# Patient Record
Sex: Female | Born: 1990 | Race: White | Hispanic: No | Marital: Married | Smoking: Never smoker
Health system: Southern US, Community
[De-identification: ages and names within clinical notes are randomized; demographics above are authoritative.]

---
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
## 2010-09-26 ENCOUNTER — Encounter: Payer: Self-pay | Admitting: Orthopaedic Surgery

## 2015-05-07 ENCOUNTER — Emergency Department (HOSPITAL_COMMUNITY): Payer: BLUE CROSS/BLUE SHIELD

## 2015-05-07 ENCOUNTER — Encounter (HOSPITAL_COMMUNITY): Payer: Self-pay | Admitting: Emergency Medicine

## 2015-05-07 ENCOUNTER — Emergency Department (HOSPITAL_COMMUNITY)
Admission: EM | Admit: 2015-05-07 | Discharge: 2015-05-07 | Disposition: A | Discharge status: Home / Self Care | Payer: BLUE CROSS/BLUE SHIELD | Attending: Emergency Medicine | Admitting: Emergency Medicine

## 2015-05-07 DIAGNOSIS — S0990XA Unspecified injury of head, initial encounter: Secondary | ICD-10-CM | POA: Insufficient documentation

## 2015-05-07 DIAGNOSIS — S0181XA Laceration without foreign body of other part of head, initial encounter: Secondary | ICD-10-CM

## 2015-05-07 DIAGNOSIS — Z79899 Other long term (current) drug therapy: Secondary | ICD-10-CM | POA: Diagnosis not present

## 2015-05-07 DIAGNOSIS — Y9364 Activity, baseball: Secondary | ICD-10-CM | POA: Diagnosis not present

## 2015-05-07 DIAGNOSIS — Y92320 Baseball field as the place of occurrence of the external cause: Secondary | ICD-10-CM | POA: Diagnosis not present

## 2015-05-07 DIAGNOSIS — Y998 Other external cause status: Secondary | ICD-10-CM | POA: Insufficient documentation

## 2015-05-07 DIAGNOSIS — S01112A Laceration without foreign body of left eyelid and periocular area, initial encounter: Secondary | ICD-10-CM | POA: Insufficient documentation

## 2015-05-07 DIAGNOSIS — W2111XA Struck by baseball bat, initial encounter: Secondary | ICD-10-CM | POA: Insufficient documentation

## 2015-05-07 MED ORDER — LIDOCAINE-EPINEPHRINE 2 %-1:100000 IJ SOLN
1.7000 mL | Freq: Once | INTRAMUSCULAR | Status: AC
  Administered 2015-05-07: 1.7 mL
  Filled 2015-05-07: qty 1

## 2015-05-07 NOTE — ED Notes (Signed)
AVS explained in detail. Acknowledged proper care of sutures and dermabond. No other c/c.
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
## 2015-05-07 NOTE — Discharge Instructions (Signed)
Facial Laceration A facial laceration is a cut on the face. These injuries can be painful and cause bleeding. Some cuts may need to be closed with stitches (sutures), skin adhesive strips, or wound glue. Cuts usually heal quickly but can leave a scar. It can take 1-2 years for the scar to go away completely. HOME CARE   Only take medicines as told by your doctor.  Follow your doctor's instructions for wound care. For Stitches:  Keep the cut clean and dry.  If you have a bandage (dressing), change it at least once a day. Change the bandage if it gets wet or dirty, or as told by your doctor.  Wash the cut with soap and water 2 times a day. Rinse the cut with water. Pat it dry with a clean towel.  Put a thin layer of medicated cream on the cut as told by your doctor.  You may shower after the first 24 hours. Do not soak the cut in water until the stitches are removed.  Have your stitches removed as told by your doctor.  Do not wear any makeup until a few days after your stitches are removed. For Skin Adhesive Strips:  Keep the cut clean and dry.  Do not get the strips wet. You may take a bath, but be careful to keep the cut dry.  If the cut gets wet, pat it dry with a clean towel.  The strips will fall off on their own. Do not remove the strips that are still stuck to the cut. For Wound Glue:  You may shower or take baths. Do not soak or scrub the cut. Do not swim. Avoid heavy sweating until the glue falls off on its own. After a shower or bath, pat the cut dry with a clean towel.  Do not put medicine or makeup on your cut until the glue falls off.  If you have a bandage, do not put tape over the glue.  Avoid lots of sunlight or tanning lamps until the glue falls off.  The glue will fall off on its own in 5-10 days. Do not pick at the glue. After Healing: Put sunscreen on the cut for the first year to reduce your scar. GET HELP RIGHT AWAY IF:   Your cut area gets red,  painful, or puffy (swollen).  You see a yellowish-white fluid (pus) coming from the cut.  You have chills or a fever. MAKE SURE YOU:   Understand these instructions.  Will watch your condition.  Will get help right away if you are not doing well or get worse. Document Released: 02/08/2008 Document Revised: 06/12/2013 Document Reviewed: 04/04/2013 [facility] Patient Information 2015 [place], [place]. This information is not intended to replace advice given to you by your health care provider. Make sure you discuss any questions you have with your health care provider. Ou had subcutaneous or under the skin sutures placed, which will dissolve on their return.  You've also had Dermabond placed over the laceration to help keep the margins aligned.  Please do not use any troponin.  Based oily products on or near the Dermabond until it comes off in 5-7 days  As discussed, your head CT is normal.  No skull fractures or intracranial bleed
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
## 2017-06-05 DIAGNOSIS — R109 Unspecified abdominal pain: Secondary | ICD-10-CM | POA: Diagnosis not present

## 2017-06-05 DIAGNOSIS — Z30431 Encounter for routine checking of intrauterine contraceptive device: Secondary | ICD-10-CM | POA: Diagnosis not present

## 2017-06-05 DIAGNOSIS — T8339XA Other mechanical complication of intrauterine contraceptive device, initial encounter: Secondary | ICD-10-CM | POA: Diagnosis not present

## 2017-06-05 DIAGNOSIS — N83202 Unspecified ovarian cyst, left side: Secondary | ICD-10-CM | POA: Diagnosis not present

## 2017-09-14 ENCOUNTER — Other Ambulatory Visit: Payer: Self-pay

## 2017-09-14 ENCOUNTER — Emergency Department
Admission: EM | Admit: 2017-09-14 | Discharge: 2017-09-14 | Disposition: A | Discharge status: Home / Self Care | Payer: Worker's Compensation | Attending: Emergency Medicine | Admitting: Emergency Medicine

## 2017-09-14 DIAGNOSIS — Y990 Civilian activity done for income or pay: Secondary | ICD-10-CM | POA: Diagnosis not present

## 2017-09-14 DIAGNOSIS — Y9389 Activity, other specified: Secondary | ICD-10-CM | POA: Insufficient documentation

## 2017-09-14 DIAGNOSIS — Z79899 Other long term (current) drug therapy: Secondary | ICD-10-CM | POA: Insufficient documentation

## 2017-09-14 DIAGNOSIS — S61112A Laceration without foreign body of left thumb with damage to nail, initial encounter: Secondary | ICD-10-CM | POA: Diagnosis present

## 2017-09-14 DIAGNOSIS — W260XXA Contact with knife, initial encounter: Secondary | ICD-10-CM | POA: Insufficient documentation

## 2017-09-14 DIAGNOSIS — Y929 Unspecified place or not applicable: Secondary | ICD-10-CM | POA: Diagnosis not present

## 2017-09-14 MED ORDER — OXYCODONE-ACETAMINOPHEN 5-325 MG PO TABS
1.0000 | ORAL_TABLET | Freq: Once | ORAL | Status: AC
  Administered 2017-09-14: 1 via ORAL
  Filled 2017-09-14: qty 1

## 2017-09-14 MED ORDER — ONDANSETRON 8 MG PO TBDP
8.0000 mg | ORAL_TABLET | Freq: Once | ORAL | Status: AC
  Administered 2017-09-14: 8 mg via ORAL
  Filled 2017-09-14: qty 1

## 2017-09-14 MED ORDER — ONDANSETRON HCL 8 MG PO TABS: 8.0000 mg | ORAL_TABLET | Freq: Three times a day (TID) | ORAL | 0 refills | Status: AC | PRN

## 2017-09-14 MED ORDER — LIDOCAINE HCL (PF) 1 % IJ SOLN
INTRAMUSCULAR | Status: AC
  Administered 2017-09-14: 5 mL
  Filled 2017-09-14: qty 5

## 2017-09-14 MED ORDER — BACITRACIN ZINC 500 UNIT/GM EX OINT: TOPICAL_OINTMENT | Freq: Two times a day (BID) | CUTANEOUS | Status: DC

## 2017-09-14 MED ORDER — TETANUS-DIPHTH-ACELL PERTUSSIS 5-2.5-18.5 LF-MCG/0.5 IM SUSP
0.5000 mL | Freq: Once | INTRAMUSCULAR | Status: AC
  Administered 2017-09-14: 0.5 mL via INTRAMUSCULAR
  Filled 2017-09-14: qty 0.5

## 2017-09-14 MED ORDER — OXYCODONE-ACETAMINOPHEN 7.5-325 MG PO TABS: 1.0000 | ORAL_TABLET | Freq: Four times a day (QID) | ORAL | 0 refills | Status: AC | PRN

## 2017-09-14 NOTE — ED Notes (Signed)
See triage note  Presents with laceration to left thumb area  Was using a box cutter at work and has laceration across nailbed

## 2017-09-14 NOTE — ED Triage Notes (Addendum)
Pt states she accidentally cut her left thumb diagonal across the nail bed and proximal with a box cutter while at work this morning . Bleeding controlled and guaze applied to wound.

## 2017-09-14 NOTE — ED Notes (Signed)
PA Ron in room sewing at this time.

## 2017-09-14 NOTE — ED Provider Notes (Addendum)
[facility] Emergency Department Provider Note   ____________________________________________   First MD Initiated Contact with Patient 09/14/17 1323     (approximate)  I have reviewed the triage vital signs and the nursing notes.   HISTORY  Chief Complaint Laceration    HPI Judith Taylor is a 27 y.o. female patient presents with a laceration to the dorsal aspect of her left thumb that occurred at work.  Laceration involves the nail and nailbed.  Patient was using a box cutter slipped and cut her thumb.  Bleeding is controlled with direct pressure.  Patient denies loss of sensation or loss of function of the left thumb.  Patient is right-hand dominant.  Patient states no pain at this time.  Patient tetanus shot is not up-to-date.  History reviewed. No pertinent past medical history.  There are no active problems to display for this patient.   History reviewed. No pertinent surgical history.  Prior to Admission medications   Medication Sig Start Date End Date Taking? Authorizing Provider  ibuprofen (ADVIL,MOTRIN) 200 MG tablet Take 200 mg by mouth every 6 (six) hours as needed for headache or moderate pain.    [provider]  Norethin Ace-Eth Estrad-FE (MINASTRIN 24 FE) 1-20 MG-MCG(24) CHEW Chew 1 tablet by mouth daily.    [provider]  ondansetron (ZOFRAN) 8 MG tablet Take 1 tablet (8 mg total) by mouth every 8 (eight) hours as needed for nausea or vomiting. 09/14/17   Joni Reining, PA-C  oxyCODONE-acetaminophen (PERCOCET) 7.5-325 MG tablet Take 1 tablet by mouth every 6 (six) hours as needed for severe pain. 09/14/17   Joni Reining, PA-C    Allergies Patient has no known allergies.  No family history on file.  Social History Social History   Tobacco Use  . Smoking status: Never Smoker  . Smokeless tobacco: Never Used  Substance Use Topics  . Alcohol use: Yes  . Drug use: Not on file    Review of  Systems Constitutional: No fever/chills Eyes: No visual changes. ENT: No sore throat. Cardiovascular: Denies chest pain. Respiratory: Denies shortness of breath. Gastrointestinal: No abdominal pain.  No nausea, no vomiting.  No diarrhea.  No constipation. Genitourinary: Negative for dysuria. Musculoskeletal: Negative for back pain. Skin: Negative for rash.  Laceration left thumb Neurological: Negative for headaches, focal weakness or numbness.   ____________________________________________   PHYSICAL EXAM:  VITAL SIGNS: ED Triage Vitals [09/14/17 1257]  Enc Vitals Group     BP 115/69     Pulse Rate 64     Resp 16     Temp 98.2 F (36.8 C)     Temp Source Oral     SpO2 100 %     Weight 130 lb (59 kg)     Height 5\' 6"  (1.676 m)     Head Circumference      Peak Flow      Pain Score      Pain Loc      Pain Edu?      Excl. in [place]?    Constitutional: Alert and oriented. Well appearing and in no acute distress. Cardiovascular: Normal rate, regular rhythm. Grossly normal heart sounds.  Good peripheral circulation. Respiratory: Normal respiratory effort.  No retractions. Lungs CTAB. Neurologic:  Normal speech and language. No gross focal neurologic deficits are appreciated. No gait instability. Skin:  3 cm laceration dorsal aspect of left thumb involving the proximal nail.  Psychiatric: Mood and affect are normal. Speech  and behavior are normal.  ____________________________________________   LABS (all labs ordered are listed, but only abnormal results are displayed)  Labs Reviewed - No data to display ____________________________________________  EKG   ____________________________________________  RADIOLOGY  No results found.  ____________________________________________   PROCEDURES  Procedure(s) performed:   Marland Kitchen.Marland Kitchen.Laceration Repair Date/Time: 09/26/2017 7:47 AM Performed by: Joni ReiningSmith, Ronald K, PA-C Authorized by: Joni ReiningSmith, Ronald K, PA-C   Consent:    Consent  obtained:  Verbal   Consent given by:  Patient   Risks discussed:  Infection and poor wound healing Anesthesia (see MAR for exact dosages):    Anesthesia method:  Nerve block   Block anesthetic:  Lidocaine 1% w/o epi   Block injection procedure:  Anatomic landmarks identified   Block outcome:  Anesthesia achieved Laceration details:    Location:  Finger   Finger location:  L thumb   Length (cm):  3 Repair type:    Repair type:  Simple Pre-procedure details:    Preparation:  Patient was prepped and draped in usual sterile fashion Exploration:    Hemostasis achieved with:  Direct pressure   Contaminated: no   Treatment:    Area cleansed with:  Betadine and saline   Amount of cleaning:  Standard   Irrigation solution:  Sterile saline   Irrigation method:  Syringe   Visualized foreign bodies/material removed: no   Skin repair:    Repair method:  Sutures   Suture size:  4-0   Suture material:  Nylon   Suture technique:  Simple interrupted   Number of sutures:  8 Approximation:    Approximation:  Close Post-procedure details:    Dressing:  Antibiotic ointment and sterile dressing   Patient tolerance of procedure:  Tolerated well, no immediate complications    Critical Care performed:   ____________________________________________   INITIAL IMPRESSION / ASSESSMENT AND PLAN / ED COURSE  As part of my medical decision making, I reviewed the following data within the electronic MEDICAL RECORD NUMBER    Left thumb laceration.  Wound was sutured.  Tetanus booster given.  Patient given discharge care instructions and advised to return back in 10 days for suture removal.     ____________________________________________   FINAL CLINICAL IMPRESSION(S) / ED DIAGNOSES  Final diagnoses:  Laceration of left thumb without foreign body with damage to nail, initial encounter     ED Discharge Orders        Ordered    oxyCODONE-acetaminophen (PERCOCET) 7.5-325 MG tablet  Every 6  hours PRN     09/14/17 1440    ondansetron (ZOFRAN) 8 MG tablet  Every 8 hours PRN     09/14/17 1440       Note:  This document was prepared using Dragon voice recognition software and may include unintentional dictation errors.    Joni ReiningSmith, Ronald K, PA-C 09/14/17 1606    Governor RooksLord, Rebecca, MD 09/16/17 1252    Joni ReiningSmith, Ronald K, PA-C 09/26/17 0750    Governor RooksLord, Rebecca, MD 09/26/17 1039

## 2017-09-14 NOTE — ED Notes (Signed)
Patient done drug screen but not enough urine ,gave patient cup of water  , patient will notify staff when needs to go again patient very willing

## 2017-09-25 DIAGNOSIS — L089 Local infection of the skin and subcutaneous tissue, unspecified: Secondary | ICD-10-CM | POA: Diagnosis not present

## 2017-09-25 DIAGNOSIS — Z4802 Encounter for removal of sutures: Secondary | ICD-10-CM | POA: Diagnosis not present

## 2017-09-25 DIAGNOSIS — T148XXS Other injury of unspecified body region, sequela: Secondary | ICD-10-CM | POA: Diagnosis not present

## 2017-11-07 DIAGNOSIS — M542 Cervicalgia: Secondary | ICD-10-CM | POA: Diagnosis not present

## 2017-11-07 DIAGNOSIS — M67912 Unspecified disorder of synovium and tendon, left shoulder: Secondary | ICD-10-CM | POA: Diagnosis not present

## 2017-11-22 DIAGNOSIS — M25512 Pain in left shoulder: Secondary | ICD-10-CM | POA: Diagnosis not present

## 2017-11-22 DIAGNOSIS — S161XXD Strain of muscle, fascia and tendon at neck level, subsequent encounter: Secondary | ICD-10-CM | POA: Diagnosis not present

## 2017-11-22 DIAGNOSIS — M25312 Other instability, left shoulder: Secondary | ICD-10-CM | POA: Diagnosis not present

## 2017-12-07 DIAGNOSIS — M25512 Pain in left shoulder: Secondary | ICD-10-CM | POA: Diagnosis not present

## 2018-01-09 DIAGNOSIS — L700 Acne vulgaris: Secondary | ICD-10-CM | POA: Diagnosis not present

## 2018-03-29 DIAGNOSIS — Z01419 Encounter for gynecological examination (general) (routine) without abnormal findings: Secondary | ICD-10-CM | POA: Diagnosis not present

## 2018-03-29 DIAGNOSIS — Z975 Presence of (intrauterine) contraceptive device: Secondary | ICD-10-CM | POA: Diagnosis not present

## 2018-03-29 DIAGNOSIS — Z6821 Body mass index (BMI) 21.0-21.9, adult: Secondary | ICD-10-CM | POA: Diagnosis not present

## 2018-03-29 DIAGNOSIS — Z1389 Encounter for screening for other disorder: Secondary | ICD-10-CM | POA: Diagnosis not present

## 2018-03-29 DIAGNOSIS — Z130 Encounter for screening for diseases of the blood and blood-forming organs and certain disorders involving the immune mechanism: Secondary | ICD-10-CM | POA: Diagnosis not present

## 2018-06-18 DIAGNOSIS — L309 Dermatitis, unspecified: Secondary | ICD-10-CM | POA: Diagnosis not present

## 2018-06-18 DIAGNOSIS — L700 Acne vulgaris: Secondary | ICD-10-CM | POA: Diagnosis not present

## 2019-04-26 DIAGNOSIS — M533 Sacrococcygeal disorders, not elsewhere classified: Secondary | ICD-10-CM | POA: Diagnosis not present

## 2019-05-08 ENCOUNTER — Other Ambulatory Visit: Payer: Self-pay | Admitting: Family Medicine

## 2019-05-08 DIAGNOSIS — M533 Sacrococcygeal disorders, not elsewhere classified: Secondary | ICD-10-CM

## 2019-05-28 ENCOUNTER — Other Ambulatory Visit: Payer: Self-pay

## 2019-05-28 DIAGNOSIS — Z20822 Contact with and (suspected) exposure to covid-19: Secondary | ICD-10-CM

## 2019-05-29 LAB — NOVEL CORONAVIRUS, NAA: SARS-CoV-2, NAA: NOT DETECTED

## 2019-05-31 ENCOUNTER — Other Ambulatory Visit: Payer: Self-pay

## 2019-07-29 ENCOUNTER — Other Ambulatory Visit: Payer: Self-pay

## 2019-07-29 DIAGNOSIS — Z20822 Contact with and (suspected) exposure to covid-19: Secondary | ICD-10-CM

## 2019-07-30 LAB — NOVEL CORONAVIRUS, NAA: SARS-CoV-2, NAA: NOT DETECTED

## 2019-09-02 ENCOUNTER — Ambulatory Visit: Payer: HRSA Program | Attending: Internal Medicine

## 2019-09-02 DIAGNOSIS — Z20822 Contact with and (suspected) exposure to covid-19: Secondary | ICD-10-CM

## 2019-09-02 DIAGNOSIS — Z20828 Contact with and (suspected) exposure to other viral communicable diseases: Secondary | ICD-10-CM | POA: Insufficient documentation

## 2019-09-03 ENCOUNTER — Other Ambulatory Visit: Payer: Self-pay

## 2019-09-04 LAB — NOVEL CORONAVIRUS, NAA: SARS-CoV-2, NAA: NOT DETECTED

## 2019-09-07 ENCOUNTER — Other Ambulatory Visit: Payer: Self-pay

## 2019-09-07 ENCOUNTER — Emergency Department (INDEPENDENT_AMBULATORY_CARE_PROVIDER_SITE_OTHER)
Admission: EM | Admit: 2019-09-07 | Discharge: 2019-09-07 | Disposition: A | Discharge status: Home / Self Care | Payer: BLUE CROSS/BLUE SHIELD | Source: Home / Self Care | Attending: Family Medicine | Admitting: Family Medicine

## 2019-09-07 DIAGNOSIS — R509 Fever, unspecified: Secondary | ICD-10-CM

## 2019-09-07 DIAGNOSIS — J069 Acute upper respiratory infection, unspecified: Secondary | ICD-10-CM

## 2019-09-07 NOTE — Discharge Instructions (Addendum)
Take plain guaifenesin (1200mg  extended release tabs such as Mucinex) twice daily, with plenty of water, for cough and congestion.  May add Pseudoephedrine (30mg , one or two every 4 to 6 hours) for sinus congestion.  Get adequate rest.   Also recommend using saline nasal spray several times daily and saline nasal irrigation (AYR is a common brand).  Use Flonase nasal spray each morning after using Afrin nasal spray and saline nasal irrigation. Try warm salt water gargles for sore throat.  Stop all antihistamines for now, and other non-prescription cough/cold preparations. May take Ibuprofen 200mg , 4 tabs every 8 hours with food for body aches, headache, etc. May take Delsym Cough Suppressant at bedtime for nighttime cough.   Isolate yourself until COVID-19 test result is available.   If your COVID19 test is positive, then you are infected with the novel coronavirus and could give the virus to others.  Please continue isolation at home for at least 10 days since the start of your symptoms.  Once you complete your 10 day quarantine, you may return to normal activities as long as you've not had a fever for over 24 hours (without taking fever reducing medicine) and your symptoms are improving. Please continue good preventive care measures, including:  frequent hand-washing, avoid touching your face, cover coughs/sneezes, stay out of crowds and keep a 6 foot distance from others.  Go to the nearest hospital emergency room if fever/cough/breathlessness are severe or illness seems like a threat to life.

## 2019-09-07 NOTE — ED Triage Notes (Signed)
Pt c/o fever, bodyaches and cough x 2 days. Fever was 102 yesterday.

## 2019-09-07 NOTE — ED Provider Notes (Signed)
[facility]    CSN: [id number] Arrival date & time: 09/07/19  1220      History   Chief Complaint Chief Complaint  Patient presents with  . Fever  . Cough  . Generalized Body Aches    HPI Judith Taylor is a 29 y.o. female.   During the past 3 days patient has developed a cough, sinus congestion, fatigue, myalgias, and headache.  She had diarrhea initially, now resolved, and had fever yesterday.  Yesterday she noted decreased sensation of taste/smell.  She complains of tightness in her anterior chest, but no pleuritic pain. She has had pneumonia in the past.  The history is provided by the patient.    History reviewed. No pertinent past medical history.  There are no problems to display for this patient.   History reviewed. No pertinent surgical history.  OB History   No obstetric history on file.      Home Medications    Prior to Admission medications   Medication Sig Start Date End Date Taking? Authorizing Provider  ibuprofen (ADVIL,MOTRIN) 200 MG tablet Take 200 mg by mouth every 6 (six) hours as needed for headache or moderate pain.    [provider]  Norethin Ace-Eth Estrad-FE (MINASTRIN 24 FE) 1-20 MG-MCG(24) CHEW Chew 1 tablet by mouth daily.    [provider]  ondansetron (ZOFRAN) 8 MG tablet Take 1 tablet (8 mg total) by mouth every 8 (eight) hours as needed for nausea or vomiting. 09/14/17   Sable Feil, PA-C  oxyCODONE-acetaminophen (PERCOCET) 7.5-325 MG tablet Take 1 tablet by mouth every 6 (six) hours as needed for severe pain. 09/14/17   Sable Feil, PA-C    Family History History reviewed. No pertinent family history.  Social History Social History   Tobacco Use  . Smoking status: Never Smoker  . Smokeless tobacco: Never Used  Substance Use Topics  . Alcohol use: Yes  . Drug use: Not on file     Allergies   Patient has no known allergies.   Review of Systems Review of Systems No sore  throat + cough No pleuritic pain No wheezing + nasal congestion + post-nasal drainage No sinus pain/pressure No itchy/red eyes No earache No hemoptysis + SOB + fever, + chills No nausea No vomiting No abdominal pain + diarrhea No urinary symptoms No skin rash + fatigue + myalgias + headache    Physical Exam Triage Vital Signs ED Triage Vitals  Enc Vitals Group     BP 09/07/19 1429 122/84     Pulse Rate 09/07/19 1429 (!) 104     Resp 09/07/19 1429 18     Temp 09/07/19 1429 99.8 F (37.7 C)     Temp Source 09/07/19 1429 Oral     SpO2 09/07/19 1429 99 %     Weight 09/07/19 1430 140 lb (63.5 kg)     Height 09/07/19 1430 5\' 6"  (1.676 m)     Head Circumference --      Peak Flow --      Pain Score 09/07/19 1430 0     Pain Loc --      Pain Edu? --      Excl. in [place]? --    No data found.  Updated Vital Signs BP 122/84 (BP Location: Left Arm)   Pulse (!) 104   Temp 99.8 F (37.7 C) (Oral)   Resp 18   Ht 5\' 6"  (1.676 m)   Wt 63.5 kg  SpO2 99%   BMI 22.60 kg/m   Visual Acuity Right Eye Distance:   Left Eye Distance:   Bilateral Distance:    Right Eye Near:   Left Eye Near:    Bilateral Near:     Physical Exam Nursing notes and Vital Signs reviewed. Appearance:  Patient appears stated age, and in no acute distress Eyes:  Pupils are equal, round, and reactive to light and accomodation.  Extraocular movement is intact.  Conjunctivae are not inflamed  Ears:  Canals normal.  Tympanic membranes normal.  Nose:   Normal turbinates.  No sinus tenderness.  Pharynx:  Normal Neck:  Supple.  Mildly enlarged lateral nodes are present, tender to palpation on the left.   Lungs:  Clear to auscultation.  Breath sounds are equal.  Moving air well. Heart:  Regular rate and rhythm without murmurs, rubs, or gallops.  Abdomen:  Nontender without masses or hepatosplenomegaly.  Bowel sounds are present.  No CVA or flank tenderness.  Extremities:  No edema.  Skin:  No rash  present.   UC Treatments / Results  Labs (all labs ordered are listed, but only abnormal results are displayed) Labs Reviewed  SARS-COV-2 RNA,(COVID-19) QUALITATIVE NAAT    EKG   Radiology No results found.  Procedures Procedures (including critical care time)  Medications Ordered in UC Medications - No data to display  Initial Impression / Assessment and Plan / UC Course  I have reviewed the triage vital signs and the nursing notes.  Pertinent labs & imaging results that were available during my care of the patient were reviewed by me and considered in my medical decision making (see chart for details).    Benign exam. There is no evidence of bacterial infection today.  Treat symptomatically for now. COVID19 send out   Final Clinical Impressions(s) / UC Diagnoses   Final diagnoses:  Fever, unspecified  Viral URI with cough     Discharge Instructions     Take plain guaifenesin (1200mg  extended release tabs such as Mucinex) twice daily, with plenty of water, for cough and congestion.  May add Pseudoephedrine (30mg , one or two every 4 to 6 hours) for sinus congestion.  Get adequate rest.   Also recommend using saline nasal spray several times daily and saline nasal irrigation (AYR is a common brand).  Use Flonase nasal spray each morning after using Afrin nasal spray and saline nasal irrigation. Try warm salt water gargles for sore throat.  Stop all antihistamines for now, and other non-prescription cough/cold preparations. May take Ibuprofen 200mg , 4 tabs every 8 hours with food for body aches, headache, etc. May take Delsym Cough Suppressant at bedtime for nighttime cough.   Isolate yourself until COVID-19 test result is available.   If your COVID19 test is positive, then you are infected with the novel coronavirus and could give the virus to others.  Please continue isolation at home for at least 10 days since the start of your symptoms.  Once you complete your 10  day quarantine, you may return to normal activities as long as you've not had a fever for over 24 hours (without taking fever reducing medicine) and your symptoms are improving. Please continue good preventive care measures, including:  frequent hand-washing, avoid touching your face, cover coughs/sneezes, stay out of crowds and keep a 6 foot distance from others.  Go to the nearest hospital emergency room if fever/cough/breathlessness are severe or illness seems like a threat to life.      ED  Prescriptions    None        Lattie Haw, MD 09/14/19 [phone]

## 2019-09-09 ENCOUNTER — Other Ambulatory Visit: Payer: BLUE CROSS/BLUE SHIELD

## 2019-09-09 LAB — SARS-COV-2 RNA,(COVID-19) QUALITATIVE NAAT: SARS CoV2 RNA: DETECTED — AB

## 2019-09-10 ENCOUNTER — Telehealth: Payer: Self-pay | Admitting: Emergency Medicine

## 2019-09-10 NOTE — Telephone Encounter (Signed)

## 2019-12-20 DIAGNOSIS — F411 Generalized anxiety disorder: Secondary | ICD-10-CM | POA: Diagnosis not present

## 2019-12-20 DIAGNOSIS — F321 Major depressive disorder, single episode, moderate: Secondary | ICD-10-CM | POA: Diagnosis not present

## 2019-12-30 DIAGNOSIS — F321 Major depressive disorder, single episode, moderate: Secondary | ICD-10-CM | POA: Diagnosis not present

## 2019-12-30 DIAGNOSIS — F411 Generalized anxiety disorder: Secondary | ICD-10-CM | POA: Diagnosis not present

## 2020-01-31 DIAGNOSIS — F411 Generalized anxiety disorder: Secondary | ICD-10-CM | POA: Diagnosis not present

## 2020-01-31 DIAGNOSIS — F321 Major depressive disorder, single episode, moderate: Secondary | ICD-10-CM | POA: Diagnosis not present

## 2020-02-21 DIAGNOSIS — F411 Generalized anxiety disorder: Secondary | ICD-10-CM | POA: Diagnosis not present

## 2020-02-21 DIAGNOSIS — F321 Major depressive disorder, single episode, moderate: Secondary | ICD-10-CM | POA: Diagnosis not present

## 2020-02-28 DIAGNOSIS — F411 Generalized anxiety disorder: Secondary | ICD-10-CM | POA: Diagnosis not present

## 2020-02-28 DIAGNOSIS — F321 Major depressive disorder, single episode, moderate: Secondary | ICD-10-CM | POA: Diagnosis not present

## 2020-03-06 DIAGNOSIS — F411 Generalized anxiety disorder: Secondary | ICD-10-CM | POA: Diagnosis not present

## 2020-03-06 DIAGNOSIS — F321 Major depressive disorder, single episode, moderate: Secondary | ICD-10-CM | POA: Diagnosis not present

## 2020-03-11 DIAGNOSIS — F411 Generalized anxiety disorder: Secondary | ICD-10-CM | POA: Diagnosis not present

## 2020-03-11 DIAGNOSIS — F321 Major depressive disorder, single episode, moderate: Secondary | ICD-10-CM | POA: Diagnosis not present

## 2020-03-27 DIAGNOSIS — F321 Major depressive disorder, single episode, moderate: Secondary | ICD-10-CM | POA: Diagnosis not present

## 2020-03-27 DIAGNOSIS — F411 Generalized anxiety disorder: Secondary | ICD-10-CM | POA: Diagnosis not present

## 2020-04-03 DIAGNOSIS — F411 Generalized anxiety disorder: Secondary | ICD-10-CM | POA: Diagnosis not present

## 2020-04-03 DIAGNOSIS — F321 Major depressive disorder, single episode, moderate: Secondary | ICD-10-CM | POA: Diagnosis not present

## 2020-04-08 DIAGNOSIS — F321 Major depressive disorder, single episode, moderate: Secondary | ICD-10-CM | POA: Diagnosis not present

## 2020-04-08 DIAGNOSIS — F411 Generalized anxiety disorder: Secondary | ICD-10-CM | POA: Diagnosis not present

## 2020-05-01 DIAGNOSIS — F411 Generalized anxiety disorder: Secondary | ICD-10-CM | POA: Diagnosis not present

## 2020-05-01 DIAGNOSIS — F321 Major depressive disorder, single episode, moderate: Secondary | ICD-10-CM | POA: Diagnosis not present

## 2020-05-13 DIAGNOSIS — F411 Generalized anxiety disorder: Secondary | ICD-10-CM | POA: Diagnosis not present

## 2020-05-13 DIAGNOSIS — F321 Major depressive disorder, single episode, moderate: Secondary | ICD-10-CM | POA: Diagnosis not present

## 2020-05-25 DIAGNOSIS — Z0000 Encounter for general adult medical examination without abnormal findings: Secondary | ICD-10-CM | POA: Diagnosis not present

## 2020-05-25 DIAGNOSIS — R7309 Other abnormal glucose: Secondary | ICD-10-CM | POA: Diagnosis not present

## 2020-05-25 DIAGNOSIS — R7989 Other specified abnormal findings of blood chemistry: Secondary | ICD-10-CM | POA: Diagnosis not present

## 2020-05-28 DIAGNOSIS — F411 Generalized anxiety disorder: Secondary | ICD-10-CM | POA: Diagnosis not present

## 2020-05-28 DIAGNOSIS — F321 Major depressive disorder, single episode, moderate: Secondary | ICD-10-CM | POA: Diagnosis not present

## 2020-06-02 DIAGNOSIS — Z0000 Encounter for general adult medical examination without abnormal findings: Secondary | ICD-10-CM | POA: Diagnosis not present

## 2020-06-17 DIAGNOSIS — F411 Generalized anxiety disorder: Secondary | ICD-10-CM | POA: Diagnosis not present

## 2020-06-17 DIAGNOSIS — F321 Major depressive disorder, single episode, moderate: Secondary | ICD-10-CM | POA: Diagnosis not present

## 2020-07-02 DIAGNOSIS — F411 Generalized anxiety disorder: Secondary | ICD-10-CM | POA: Diagnosis not present

## 2020-07-02 DIAGNOSIS — F321 Major depressive disorder, single episode, moderate: Secondary | ICD-10-CM | POA: Diagnosis not present

## 2020-07-08 DIAGNOSIS — F321 Major depressive disorder, single episode, moderate: Secondary | ICD-10-CM | POA: Diagnosis not present

## 2020-07-08 DIAGNOSIS — F411 Generalized anxiety disorder: Secondary | ICD-10-CM | POA: Diagnosis not present

## 2020-07-24 DIAGNOSIS — F411 Generalized anxiety disorder: Secondary | ICD-10-CM | POA: Diagnosis not present

## 2020-07-24 DIAGNOSIS — F321 Major depressive disorder, single episode, moderate: Secondary | ICD-10-CM | POA: Diagnosis not present

## 2020-08-14 DIAGNOSIS — F321 Major depressive disorder, single episode, moderate: Secondary | ICD-10-CM | POA: Diagnosis not present

## 2020-08-14 DIAGNOSIS — F411 Generalized anxiety disorder: Secondary | ICD-10-CM | POA: Diagnosis not present

## 2020-08-27 DIAGNOSIS — F411 Generalized anxiety disorder: Secondary | ICD-10-CM | POA: Diagnosis not present

## 2020-08-27 DIAGNOSIS — F321 Major depressive disorder, single episode, moderate: Secondary | ICD-10-CM | POA: Diagnosis not present

## 2020-09-11 DIAGNOSIS — F321 Major depressive disorder, single episode, moderate: Secondary | ICD-10-CM | POA: Diagnosis not present

## 2020-09-11 DIAGNOSIS — F411 Generalized anxiety disorder: Secondary | ICD-10-CM | POA: Diagnosis not present

## 2020-09-25 DIAGNOSIS — F411 Generalized anxiety disorder: Secondary | ICD-10-CM | POA: Diagnosis not present

## 2020-09-25 DIAGNOSIS — F321 Major depressive disorder, single episode, moderate: Secondary | ICD-10-CM | POA: Diagnosis not present

## 2020-10-23 DIAGNOSIS — F411 Generalized anxiety disorder: Secondary | ICD-10-CM | POA: Diagnosis not present

## 2020-10-23 DIAGNOSIS — F321 Major depressive disorder, single episode, moderate: Secondary | ICD-10-CM | POA: Diagnosis not present

## 2020-10-23 DIAGNOSIS — F331 Major depressive disorder, recurrent, moderate: Secondary | ICD-10-CM | POA: Diagnosis not present

## 2020-11-13 DIAGNOSIS — F411 Generalized anxiety disorder: Secondary | ICD-10-CM | POA: Diagnosis not present

## 2020-11-13 DIAGNOSIS — F321 Major depressive disorder, single episode, moderate: Secondary | ICD-10-CM | POA: Diagnosis not present

## 2020-11-20 DIAGNOSIS — F411 Generalized anxiety disorder: Secondary | ICD-10-CM | POA: Diagnosis not present

## 2020-11-20 DIAGNOSIS — F331 Major depressive disorder, recurrent, moderate: Secondary | ICD-10-CM | POA: Diagnosis not present

## 2020-11-27 DIAGNOSIS — F411 Generalized anxiety disorder: Secondary | ICD-10-CM | POA: Diagnosis not present

## 2020-11-27 DIAGNOSIS — F321 Major depressive disorder, single episode, moderate: Secondary | ICD-10-CM | POA: Diagnosis not present

## 2020-12-01 DIAGNOSIS — F411 Generalized anxiety disorder: Secondary | ICD-10-CM | POA: Diagnosis not present

## 2020-12-01 DIAGNOSIS — F331 Major depressive disorder, recurrent, moderate: Secondary | ICD-10-CM | POA: Diagnosis not present

## 2020-12-11 DIAGNOSIS — F411 Generalized anxiety disorder: Secondary | ICD-10-CM | POA: Diagnosis not present

## 2020-12-11 DIAGNOSIS — F321 Major depressive disorder, single episode, moderate: Secondary | ICD-10-CM | POA: Diagnosis not present

## 2020-12-16 DIAGNOSIS — F331 Major depressive disorder, recurrent, moderate: Secondary | ICD-10-CM | POA: Diagnosis not present

## 2020-12-16 DIAGNOSIS — F411 Generalized anxiety disorder: Secondary | ICD-10-CM | POA: Diagnosis not present

## 2021-01-07 DIAGNOSIS — F321 Major depressive disorder, single episode, moderate: Secondary | ICD-10-CM | POA: Diagnosis not present

## 2021-01-07 DIAGNOSIS — F411 Generalized anxiety disorder: Secondary | ICD-10-CM | POA: Diagnosis not present

## 2021-01-20 DIAGNOSIS — F411 Generalized anxiety disorder: Secondary | ICD-10-CM | POA: Diagnosis not present

## 2021-01-20 DIAGNOSIS — F331 Major depressive disorder, recurrent, moderate: Secondary | ICD-10-CM | POA: Diagnosis not present

## 2021-02-09 DIAGNOSIS — F411 Generalized anxiety disorder: Secondary | ICD-10-CM | POA: Diagnosis not present

## 2021-02-09 DIAGNOSIS — F331 Major depressive disorder, recurrent, moderate: Secondary | ICD-10-CM | POA: Diagnosis not present

## 2021-02-15 DIAGNOSIS — Z20822 Contact with and (suspected) exposure to covid-19: Secondary | ICD-10-CM | POA: Diagnosis not present

## 2021-02-26 DIAGNOSIS — F411 Generalized anxiety disorder: Secondary | ICD-10-CM | POA: Diagnosis not present

## 2021-02-26 DIAGNOSIS — F331 Major depressive disorder, recurrent, moderate: Secondary | ICD-10-CM | POA: Diagnosis not present

## 2021-03-10 DIAGNOSIS — F411 Generalized anxiety disorder: Secondary | ICD-10-CM | POA: Diagnosis not present

## 2021-03-10 DIAGNOSIS — F331 Major depressive disorder, recurrent, moderate: Secondary | ICD-10-CM | POA: Diagnosis not present

## 2021-03-11 DIAGNOSIS — F331 Major depressive disorder, recurrent, moderate: Secondary | ICD-10-CM | POA: Diagnosis not present

## 2021-03-15 DIAGNOSIS — F411 Generalized anxiety disorder: Secondary | ICD-10-CM | POA: Diagnosis not present

## 2021-03-15 DIAGNOSIS — F331 Major depressive disorder, recurrent, moderate: Secondary | ICD-10-CM | POA: Diagnosis not present

## 2021-04-01 DIAGNOSIS — Z30432 Encounter for removal of intrauterine contraceptive device: Secondary | ICD-10-CM | POA: Diagnosis not present

## 2021-04-16 DIAGNOSIS — F411 Generalized anxiety disorder: Secondary | ICD-10-CM | POA: Diagnosis not present

## 2021-04-16 DIAGNOSIS — F331 Major depressive disorder, recurrent, moderate: Secondary | ICD-10-CM | POA: Diagnosis not present

## 2021-04-22 DIAGNOSIS — F331 Major depressive disorder, recurrent, moderate: Secondary | ICD-10-CM | POA: Diagnosis not present

## 2021-04-26 DIAGNOSIS — F331 Major depressive disorder, recurrent, moderate: Secondary | ICD-10-CM | POA: Diagnosis not present

## 2021-04-26 DIAGNOSIS — F411 Generalized anxiety disorder: Secondary | ICD-10-CM | POA: Diagnosis not present

## 2021-05-19 DIAGNOSIS — F331 Major depressive disorder, recurrent, moderate: Secondary | ICD-10-CM | POA: Diagnosis not present

## 2021-05-19 DIAGNOSIS — F411 Generalized anxiety disorder: Secondary | ICD-10-CM | POA: Diagnosis not present

## 2021-05-27 DIAGNOSIS — F331 Major depressive disorder, recurrent, moderate: Secondary | ICD-10-CM | POA: Diagnosis not present

## 2021-05-27 DIAGNOSIS — F411 Generalized anxiety disorder: Secondary | ICD-10-CM | POA: Diagnosis not present

## 2021-06-11 DIAGNOSIS — F331 Major depressive disorder, recurrent, moderate: Secondary | ICD-10-CM | POA: Diagnosis not present

## 2021-06-11 DIAGNOSIS — F411 Generalized anxiety disorder: Secondary | ICD-10-CM | POA: Diagnosis not present

## 2021-06-17 DIAGNOSIS — F331 Major depressive disorder, recurrent, moderate: Secondary | ICD-10-CM | POA: Diagnosis not present

## 2021-06-17 DIAGNOSIS — F411 Generalized anxiety disorder: Secondary | ICD-10-CM | POA: Diagnosis not present

## 2021-07-02 DIAGNOSIS — F331 Major depressive disorder, recurrent, moderate: Secondary | ICD-10-CM | POA: Diagnosis not present

## 2021-07-02 DIAGNOSIS — F411 Generalized anxiety disorder: Secondary | ICD-10-CM | POA: Diagnosis not present

## 2021-07-12 DIAGNOSIS — F331 Major depressive disorder, recurrent, moderate: Secondary | ICD-10-CM | POA: Diagnosis not present

## 2021-07-12 DIAGNOSIS — F411 Generalized anxiety disorder: Secondary | ICD-10-CM | POA: Diagnosis not present

## 2021-08-11 DIAGNOSIS — F331 Major depressive disorder, recurrent, moderate: Secondary | ICD-10-CM | POA: Diagnosis not present

## 2021-08-11 DIAGNOSIS — F411 Generalized anxiety disorder: Secondary | ICD-10-CM | POA: Diagnosis not present

## 2021-08-19 DIAGNOSIS — F331 Major depressive disorder, recurrent, moderate: Secondary | ICD-10-CM | POA: Diagnosis not present

## 2021-08-19 DIAGNOSIS — F411 Generalized anxiety disorder: Secondary | ICD-10-CM | POA: Diagnosis not present

## 2021-08-23 DIAGNOSIS — F411 Generalized anxiety disorder: Secondary | ICD-10-CM | POA: Diagnosis not present

## 2021-08-23 DIAGNOSIS — F331 Major depressive disorder, recurrent, moderate: Secondary | ICD-10-CM | POA: Diagnosis not present

## 2021-08-26 DIAGNOSIS — Z01419 Encounter for gynecological examination (general) (routine) without abnormal findings: Secondary | ICD-10-CM | POA: Diagnosis not present

## 2021-08-26 DIAGNOSIS — F331 Major depressive disorder, recurrent, moderate: Secondary | ICD-10-CM | POA: Diagnosis not present

## 2021-08-26 DIAGNOSIS — Z1389 Encounter for screening for other disorder: Secondary | ICD-10-CM | POA: Diagnosis not present

## 2021-08-26 DIAGNOSIS — Z130 Encounter for screening for diseases of the blood and blood-forming organs and certain disorders involving the immune mechanism: Secondary | ICD-10-CM | POA: Diagnosis not present

## 2021-08-26 DIAGNOSIS — Z124 Encounter for screening for malignant neoplasm of cervix: Secondary | ICD-10-CM | POA: Diagnosis not present

## 2021-08-26 DIAGNOSIS — F411 Generalized anxiety disorder: Secondary | ICD-10-CM | POA: Diagnosis not present

## 2021-08-31 DIAGNOSIS — F331 Major depressive disorder, recurrent, moderate: Secondary | ICD-10-CM | POA: Diagnosis not present

## 2021-08-31 DIAGNOSIS — F411 Generalized anxiety disorder: Secondary | ICD-10-CM | POA: Diagnosis not present

## 2021-09-03 DIAGNOSIS — F411 Generalized anxiety disorder: Secondary | ICD-10-CM | POA: Diagnosis not present

## 2021-09-03 DIAGNOSIS — F331 Major depressive disorder, recurrent, moderate: Secondary | ICD-10-CM | POA: Diagnosis not present

## 2021-09-09 DIAGNOSIS — F331 Major depressive disorder, recurrent, moderate: Secondary | ICD-10-CM | POA: Diagnosis not present

## 2021-09-09 DIAGNOSIS — F411 Generalized anxiety disorder: Secondary | ICD-10-CM | POA: Diagnosis not present

## 2021-09-10 DIAGNOSIS — F411 Generalized anxiety disorder: Secondary | ICD-10-CM | POA: Diagnosis not present

## 2021-09-10 DIAGNOSIS — F3181 Bipolar II disorder: Secondary | ICD-10-CM | POA: Diagnosis not present

## 2021-09-14 DIAGNOSIS — F3181 Bipolar II disorder: Secondary | ICD-10-CM | POA: Diagnosis not present

## 2021-09-14 DIAGNOSIS — F411 Generalized anxiety disorder: Secondary | ICD-10-CM | POA: Diagnosis not present

## 2021-09-16 DIAGNOSIS — F411 Generalized anxiety disorder: Secondary | ICD-10-CM | POA: Diagnosis not present

## 2021-09-16 DIAGNOSIS — F331 Major depressive disorder, recurrent, moderate: Secondary | ICD-10-CM | POA: Diagnosis not present

## 2021-09-24 DIAGNOSIS — F3181 Bipolar II disorder: Secondary | ICD-10-CM | POA: Diagnosis not present

## 2021-09-24 DIAGNOSIS — F411 Generalized anxiety disorder: Secondary | ICD-10-CM | POA: Diagnosis not present

## 2021-09-28 DIAGNOSIS — F331 Major depressive disorder, recurrent, moderate: Secondary | ICD-10-CM | POA: Diagnosis not present

## 2021-09-28 DIAGNOSIS — F411 Generalized anxiety disorder: Secondary | ICD-10-CM | POA: Diagnosis not present

## 2021-09-30 DIAGNOSIS — F411 Generalized anxiety disorder: Secondary | ICD-10-CM | POA: Diagnosis not present

## 2021-09-30 DIAGNOSIS — F3181 Bipolar II disorder: Secondary | ICD-10-CM | POA: Diagnosis not present

## 2021-10-08 DIAGNOSIS — F411 Generalized anxiety disorder: Secondary | ICD-10-CM | POA: Diagnosis not present

## 2021-10-08 DIAGNOSIS — F3181 Bipolar II disorder: Secondary | ICD-10-CM | POA: Diagnosis not present

## 2021-10-14 DIAGNOSIS — F411 Generalized anxiety disorder: Secondary | ICD-10-CM | POA: Diagnosis not present

## 2021-10-14 DIAGNOSIS — F331 Major depressive disorder, recurrent, moderate: Secondary | ICD-10-CM | POA: Diagnosis not present

## 2021-10-18 DIAGNOSIS — F3181 Bipolar II disorder: Secondary | ICD-10-CM | POA: Diagnosis not present

## 2021-10-18 DIAGNOSIS — F411 Generalized anxiety disorder: Secondary | ICD-10-CM | POA: Diagnosis not present

## 2021-11-01 DIAGNOSIS — F3181 Bipolar II disorder: Secondary | ICD-10-CM | POA: Diagnosis not present

## 2021-11-01 DIAGNOSIS — F411 Generalized anxiety disorder: Secondary | ICD-10-CM | POA: Diagnosis not present

## 2021-11-03 DIAGNOSIS — F411 Generalized anxiety disorder: Secondary | ICD-10-CM | POA: Diagnosis not present

## 2021-11-03 DIAGNOSIS — F331 Major depressive disorder, recurrent, moderate: Secondary | ICD-10-CM | POA: Diagnosis not present

## 2021-11-15 DIAGNOSIS — F3181 Bipolar II disorder: Secondary | ICD-10-CM | POA: Diagnosis not present

## 2021-11-15 DIAGNOSIS — F411 Generalized anxiety disorder: Secondary | ICD-10-CM | POA: Diagnosis not present

## 2021-11-24 DIAGNOSIS — F331 Major depressive disorder, recurrent, moderate: Secondary | ICD-10-CM | POA: Diagnosis not present

## 2021-11-24 DIAGNOSIS — F411 Generalized anxiety disorder: Secondary | ICD-10-CM | POA: Diagnosis not present

## 2021-11-26 DIAGNOSIS — F3181 Bipolar II disorder: Secondary | ICD-10-CM | POA: Diagnosis not present

## 2021-11-26 DIAGNOSIS — F411 Generalized anxiety disorder: Secondary | ICD-10-CM | POA: Diagnosis not present

## 2021-12-08 DIAGNOSIS — F411 Generalized anxiety disorder: Secondary | ICD-10-CM | POA: Diagnosis not present

## 2021-12-08 DIAGNOSIS — F3181 Bipolar II disorder: Secondary | ICD-10-CM | POA: Diagnosis not present

## 2021-12-14 ENCOUNTER — Other Ambulatory Visit: Payer: Self-pay | Admitting: Dentistry

## 2021-12-14 DIAGNOSIS — M26633 Articular disc disorder of bilateral temporomandibular joint: Secondary | ICD-10-CM

## 2021-12-14 DIAGNOSIS — R6884 Jaw pain: Secondary | ICD-10-CM

## 2021-12-15 DIAGNOSIS — F331 Major depressive disorder, recurrent, moderate: Secondary | ICD-10-CM | POA: Diagnosis not present

## 2021-12-15 DIAGNOSIS — F411 Generalized anxiety disorder: Secondary | ICD-10-CM | POA: Diagnosis not present

## 2021-12-15 DIAGNOSIS — H938X3 Other specified disorders of ear, bilateral: Secondary | ICD-10-CM | POA: Diagnosis not present

## 2021-12-19 ENCOUNTER — Other Ambulatory Visit: Payer: BLUE CROSS/BLUE SHIELD

## 2021-12-22 DIAGNOSIS — R6884 Jaw pain: Secondary | ICD-10-CM | POA: Diagnosis not present

## 2021-12-22 DIAGNOSIS — F419 Anxiety disorder, unspecified: Secondary | ICD-10-CM | POA: Diagnosis not present

## 2021-12-24 DIAGNOSIS — F411 Generalized anxiety disorder: Secondary | ICD-10-CM | POA: Diagnosis not present

## 2021-12-24 DIAGNOSIS — F3181 Bipolar II disorder: Secondary | ICD-10-CM | POA: Diagnosis not present

## 2021-12-30 ENCOUNTER — Ambulatory Visit
Admission: RE | Admit: 2021-12-30 | Discharge: 2021-12-30 | Disposition: A | Discharge status: Home / Self Care | Payer: BLUE CROSS/BLUE SHIELD | Source: Ambulatory Visit | Attending: Dentistry | Admitting: Dentistry

## 2021-12-30 DIAGNOSIS — M26601 Right temporomandibular joint disorder, unspecified: Secondary | ICD-10-CM | POA: Diagnosis not present

## 2021-12-30 DIAGNOSIS — M26602 Left temporomandibular joint disorder, unspecified: Secondary | ICD-10-CM | POA: Diagnosis not present

## 2021-12-30 DIAGNOSIS — S0302XA Dislocation of jaw, left side, initial encounter: Secondary | ICD-10-CM | POA: Diagnosis not present

## 2021-12-30 DIAGNOSIS — S0301XA Dislocation of jaw, right side, initial encounter: Secondary | ICD-10-CM | POA: Diagnosis not present

## 2021-12-30 DIAGNOSIS — R6884 Jaw pain: Secondary | ICD-10-CM

## 2021-12-30 DIAGNOSIS — M26633 Articular disc disorder of bilateral temporomandibular joint: Secondary | ICD-10-CM

## 2022-01-01 ENCOUNTER — Other Ambulatory Visit: Payer: BLUE CROSS/BLUE SHIELD

## 2022-01-07 DIAGNOSIS — F411 Generalized anxiety disorder: Secondary | ICD-10-CM | POA: Diagnosis not present

## 2022-01-07 DIAGNOSIS — F3181 Bipolar II disorder: Secondary | ICD-10-CM | POA: Diagnosis not present

## 2022-01-19 DIAGNOSIS — Z3169 Encounter for other general counseling and advice on procreation: Secondary | ICD-10-CM | POA: Diagnosis not present

## 2022-01-19 DIAGNOSIS — F3181 Bipolar II disorder: Secondary | ICD-10-CM | POA: Diagnosis not present

## 2022-01-19 DIAGNOSIS — F411 Generalized anxiety disorder: Secondary | ICD-10-CM | POA: Diagnosis not present

## 2022-01-26 DIAGNOSIS — F411 Generalized anxiety disorder: Secondary | ICD-10-CM | POA: Diagnosis not present

## 2022-01-26 DIAGNOSIS — F331 Major depressive disorder, recurrent, moderate: Secondary | ICD-10-CM | POA: Diagnosis not present

## 2022-02-02 DIAGNOSIS — F3181 Bipolar II disorder: Secondary | ICD-10-CM | POA: Diagnosis not present

## 2022-02-02 DIAGNOSIS — F411 Generalized anxiety disorder: Secondary | ICD-10-CM | POA: Diagnosis not present

## 2022-02-17 DIAGNOSIS — F411 Generalized anxiety disorder: Secondary | ICD-10-CM | POA: Diagnosis not present

## 2022-02-17 DIAGNOSIS — F3181 Bipolar II disorder: Secondary | ICD-10-CM | POA: Diagnosis not present

## 2022-03-11 DIAGNOSIS — F411 Generalized anxiety disorder: Secondary | ICD-10-CM | POA: Diagnosis not present

## 2022-03-11 DIAGNOSIS — F3181 Bipolar II disorder: Secondary | ICD-10-CM | POA: Diagnosis not present

## 2022-03-23 DIAGNOSIS — F331 Major depressive disorder, recurrent, moderate: Secondary | ICD-10-CM | POA: Diagnosis not present

## 2022-03-23 DIAGNOSIS — F411 Generalized anxiety disorder: Secondary | ICD-10-CM | POA: Diagnosis not present

## 2022-03-29 DIAGNOSIS — F3181 Bipolar II disorder: Secondary | ICD-10-CM | POA: Diagnosis not present

## 2022-03-29 DIAGNOSIS — F411 Generalized anxiety disorder: Secondary | ICD-10-CM | POA: Diagnosis not present

## 2022-04-05 DIAGNOSIS — O209 Hemorrhage in early pregnancy, unspecified: Secondary | ICD-10-CM | POA: Diagnosis not present

## 2022-04-07 DIAGNOSIS — O209 Hemorrhage in early pregnancy, unspecified: Secondary | ICD-10-CM | POA: Diagnosis not present

## 2022-04-20 DIAGNOSIS — Z3A08 8 weeks gestation of pregnancy: Secondary | ICD-10-CM | POA: Diagnosis not present

## 2022-04-20 DIAGNOSIS — O26891 Other specified pregnancy related conditions, first trimester: Secondary | ICD-10-CM | POA: Diagnosis not present

## 2022-04-20 DIAGNOSIS — O3680X0 Pregnancy with inconclusive fetal viability, not applicable or unspecified: Secondary | ICD-10-CM | POA: Diagnosis not present

## 2022-04-21 DIAGNOSIS — Z3201 Encounter for pregnancy test, result positive: Secondary | ICD-10-CM | POA: Diagnosis not present

## 2022-04-22 DIAGNOSIS — F3181 Bipolar II disorder: Secondary | ICD-10-CM | POA: Diagnosis not present

## 2022-04-22 DIAGNOSIS — F411 Generalized anxiety disorder: Secondary | ICD-10-CM | POA: Diagnosis not present

## 2022-05-04 DIAGNOSIS — F411 Generalized anxiety disorder: Secondary | ICD-10-CM | POA: Diagnosis not present

## 2022-05-04 DIAGNOSIS — F3181 Bipolar II disorder: Secondary | ICD-10-CM | POA: Diagnosis not present

## 2022-05-17 DIAGNOSIS — O26891 Other specified pregnancy related conditions, first trimester: Secondary | ICD-10-CM | POA: Diagnosis not present

## 2022-05-17 DIAGNOSIS — Z3A10 10 weeks gestation of pregnancy: Secondary | ICD-10-CM | POA: Diagnosis not present

## 2022-05-17 DIAGNOSIS — Z369 Encounter for antenatal screening, unspecified: Secondary | ICD-10-CM | POA: Diagnosis not present

## 2022-05-17 DIAGNOSIS — Z3401 Encounter for supervision of normal first pregnancy, first trimester: Secondary | ICD-10-CM | POA: Diagnosis not present

## 2022-05-17 DIAGNOSIS — Z113 Encounter for screening for infections with a predominantly sexual mode of transmission: Secondary | ICD-10-CM | POA: Diagnosis not present

## 2022-05-17 LAB — OB RESULTS CONSOLE RUBELLA ANTIBODY, IGM: Rubella: IMMUNE

## 2022-05-18 DIAGNOSIS — F331 Major depressive disorder, recurrent, moderate: Secondary | ICD-10-CM | POA: Diagnosis not present

## 2022-05-18 DIAGNOSIS — F411 Generalized anxiety disorder: Secondary | ICD-10-CM | POA: Diagnosis not present

## 2022-06-03 DIAGNOSIS — F3181 Bipolar II disorder: Secondary | ICD-10-CM | POA: Diagnosis not present

## 2022-06-03 DIAGNOSIS — F411 Generalized anxiety disorder: Secondary | ICD-10-CM | POA: Diagnosis not present

## 2022-06-29 DIAGNOSIS — F411 Generalized anxiety disorder: Secondary | ICD-10-CM | POA: Diagnosis not present

## 2022-06-29 DIAGNOSIS — F3181 Bipolar II disorder: Secondary | ICD-10-CM | POA: Diagnosis not present

## 2022-07-15 DIAGNOSIS — F3181 Bipolar II disorder: Secondary | ICD-10-CM | POA: Diagnosis not present

## 2022-07-15 DIAGNOSIS — F411 Generalized anxiety disorder: Secondary | ICD-10-CM | POA: Diagnosis not present

## 2022-07-22 DIAGNOSIS — Z363 Encounter for antenatal screening for malformations: Secondary | ICD-10-CM | POA: Diagnosis not present

## 2022-07-22 DIAGNOSIS — Z3A19 19 weeks gestation of pregnancy: Secondary | ICD-10-CM | POA: Diagnosis not present

## 2022-07-22 DIAGNOSIS — Z3401 Encounter for supervision of normal first pregnancy, first trimester: Secondary | ICD-10-CM | POA: Diagnosis not present

## 2022-07-25 DIAGNOSIS — F331 Major depressive disorder, recurrent, moderate: Secondary | ICD-10-CM | POA: Diagnosis not present

## 2022-07-25 DIAGNOSIS — F411 Generalized anxiety disorder: Secondary | ICD-10-CM | POA: Diagnosis not present

## 2022-07-27 DIAGNOSIS — F3181 Bipolar II disorder: Secondary | ICD-10-CM | POA: Diagnosis not present

## 2022-07-27 DIAGNOSIS — F411 Generalized anxiety disorder: Secondary | ICD-10-CM | POA: Diagnosis not present

## 2022-08-16 DIAGNOSIS — Z3402 Encounter for supervision of normal first pregnancy, second trimester: Secondary | ICD-10-CM | POA: Diagnosis not present

## 2022-08-16 DIAGNOSIS — Z362 Encounter for other antenatal screening follow-up: Secondary | ICD-10-CM | POA: Diagnosis not present

## 2022-08-16 DIAGNOSIS — Z3A23 23 weeks gestation of pregnancy: Secondary | ICD-10-CM | POA: Diagnosis not present

## 2022-08-17 DIAGNOSIS — F3181 Bipolar II disorder: Secondary | ICD-10-CM | POA: Diagnosis not present

## 2022-08-17 DIAGNOSIS — F411 Generalized anxiety disorder: Secondary | ICD-10-CM | POA: Diagnosis not present

## 2022-08-18 LAB — OB RESULTS CONSOLE GC/CHLAMYDIA
Chlamydia: NEGATIVE
Neisseria Gonorrhea: NEGATIVE

## 2022-08-18 LAB — HEPATITIS C ANTIBODY: HCV Ab: NEGATIVE

## 2022-08-18 LAB — OB RESULTS CONSOLE RUBELLA ANTIBODY, IGM: Rubella: IMMUNE

## 2022-08-18 LAB — OB RESULTS CONSOLE HEPATITIS B SURFACE ANTIGEN: Hepatitis B Surface Ag: NEGATIVE

## 2022-08-18 LAB — OB RESULTS CONSOLE RPR: RPR: NONREACTIVE

## 2022-08-18 LAB — OB RESULTS CONSOLE HIV ANTIBODY (ROUTINE TESTING): HIV: NONREACTIVE

## 2022-08-24 DIAGNOSIS — F411 Generalized anxiety disorder: Secondary | ICD-10-CM | POA: Diagnosis not present

## 2022-08-24 DIAGNOSIS — F331 Major depressive disorder, recurrent, moderate: Secondary | ICD-10-CM | POA: Diagnosis not present

## 2022-09-02 DIAGNOSIS — F3181 Bipolar II disorder: Secondary | ICD-10-CM | POA: Diagnosis not present

## 2022-09-02 DIAGNOSIS — F411 Generalized anxiety disorder: Secondary | ICD-10-CM | POA: Diagnosis not present

## 2022-09-05 NOTE — L&D Delivery Note (Signed)
OB/GYN Faculty Practice Delivery Note  Maysel Mccolm is a 32 y.o. G1P0 s/p SVD at [redacted]w[redacted]d. She was admitted for for SROM w MSF @ 0300/ SOL.   ROM: 7h 73m with light MSF fluid GBS Status: unknown (pending culture 12/20/22) Maximum Maternal Temperature: not taken  Labor Progress: Ms Koppel arrived in active labor ~0730 s/p SROM with light MSF @ 0300 at home while planning home birth. She progressed to complete spontaneously and pushed to SVD.  Delivery Date/Time: April 16th, 2024 at 1020 Delivery: Called to room and patient was complete and pushing in the squatting position on the bed. Head delivered LOA. No nuchal cord present, however there was a true knot in the cord. Shoulder and body delivered in usual fashion. Infant with spontaneous cry, placed on mother's abdomen, dried and stimulated. Cord clamped x 2 and cut by FOB once it had stopped pulsing. Cord blood drawn. Placenta delivered spontaneously with gentle cord traction. Fundus firm with initial massage; became boggy but firmed up w additional massage. Labia, perineum, vagina, and cervix inspected and found to have a 1st degree perineal lac.   Placenta: spont, intact; to L&D Complications: none Lacerations: 1st deg perineal repaired with 2 throws of 3-0 Monocryl EBL: 443 cc Analgesia: 1% lidocaine  Postpartum Planning  message to sent to schedule follow-up (at [facility])   Infant: boy  APGARs 9/9  3330g (7lb 5.5oz)  [name], CNM  12/20/2022 10:52 AM

## 2022-09-07 DIAGNOSIS — F411 Generalized anxiety disorder: Secondary | ICD-10-CM | POA: Diagnosis not present

## 2022-09-07 DIAGNOSIS — F3181 Bipolar II disorder: Secondary | ICD-10-CM | POA: Diagnosis not present

## 2022-09-20 DIAGNOSIS — F411 Generalized anxiety disorder: Secondary | ICD-10-CM | POA: Diagnosis not present

## 2022-09-20 DIAGNOSIS — F3181 Bipolar II disorder: Secondary | ICD-10-CM | POA: Diagnosis not present

## 2022-09-26 DIAGNOSIS — Z8719 Personal history of other diseases of the digestive system: Secondary | ICD-10-CM | POA: Diagnosis not present

## 2022-09-26 DIAGNOSIS — K625 Hemorrhage of anus and rectum: Secondary | ICD-10-CM | POA: Diagnosis not present

## 2022-10-04 DIAGNOSIS — F411 Generalized anxiety disorder: Secondary | ICD-10-CM | POA: Diagnosis not present

## 2022-10-04 DIAGNOSIS — F331 Major depressive disorder, recurrent, moderate: Secondary | ICD-10-CM | POA: Diagnosis not present

## 2022-10-07 DIAGNOSIS — F411 Generalized anxiety disorder: Secondary | ICD-10-CM | POA: Diagnosis not present

## 2022-10-07 DIAGNOSIS — F3181 Bipolar II disorder: Secondary | ICD-10-CM | POA: Diagnosis not present

## 2022-11-01 DIAGNOSIS — F3181 Bipolar II disorder: Secondary | ICD-10-CM | POA: Diagnosis not present

## 2022-11-01 DIAGNOSIS — F411 Generalized anxiety disorder: Secondary | ICD-10-CM | POA: Diagnosis not present

## 2022-11-01 DIAGNOSIS — F331 Major depressive disorder, recurrent, moderate: Secondary | ICD-10-CM | POA: Diagnosis not present

## 2022-11-25 DIAGNOSIS — F3181 Bipolar II disorder: Secondary | ICD-10-CM | POA: Diagnosis not present

## 2022-11-25 DIAGNOSIS — F411 Generalized anxiety disorder: Secondary | ICD-10-CM | POA: Diagnosis not present

## 2022-12-13 DIAGNOSIS — F411 Generalized anxiety disorder: Secondary | ICD-10-CM | POA: Diagnosis not present

## 2022-12-13 DIAGNOSIS — F3181 Bipolar II disorder: Secondary | ICD-10-CM | POA: Diagnosis not present

## 2022-12-20 ENCOUNTER — Inpatient Hospital Stay (HOSPITAL_COMMUNITY)
Admission: AD | Admit: 2022-12-20 | Discharge: 2022-12-20 | DRG: 807 | Discharge status: Against Medical Advice | Payer: BLUE CROSS/BLUE SHIELD | Attending: Obstetrics & Gynecology | Admitting: Obstetrics & Gynecology

## 2022-12-20 ENCOUNTER — Encounter (HOSPITAL_COMMUNITY): Payer: Self-pay

## 2022-12-20 DIAGNOSIS — O99344 Other mental disorders complicating childbirth: Secondary | ICD-10-CM | POA: Diagnosis present

## 2022-12-20 DIAGNOSIS — Z3A41 41 weeks gestation of pregnancy: Secondary | ICD-10-CM

## 2022-12-20 DIAGNOSIS — F32A Depression, unspecified: Secondary | ICD-10-CM | POA: Diagnosis not present

## 2022-12-20 DIAGNOSIS — F39 Unspecified mood [affective] disorder: Secondary | ICD-10-CM | POA: Insufficient documentation

## 2022-12-20 DIAGNOSIS — F419 Anxiety disorder, unspecified: Secondary | ICD-10-CM | POA: Diagnosis present

## 2022-12-20 DIAGNOSIS — O480 Post-term pregnancy: Secondary | ICD-10-CM | POA: Diagnosis not present

## 2022-12-20 DIAGNOSIS — Z2882 Immunization not carried out because of caregiver refusal: Secondary | ICD-10-CM | POA: Diagnosis not present

## 2022-12-20 DIAGNOSIS — Z5329 Procedure and treatment not carried out because of patient's decision for other reasons: Secondary | ICD-10-CM | POA: Diagnosis not present

## 2022-12-20 DIAGNOSIS — O4202 Full-term premature rupture of membranes, onset of labor within 24 hours of rupture: Secondary | ICD-10-CM | POA: Diagnosis not present

## 2022-12-20 DIAGNOSIS — Z23 Encounter for immunization: Secondary | ICD-10-CM | POA: Diagnosis not present

## 2022-12-20 DIAGNOSIS — Z051 Observation and evaluation of newborn for suspected infectious condition ruled out: Secondary | ICD-10-CM | POA: Diagnosis not present

## 2022-12-20 LAB — RPR: RPR Ser Ql: NONREACTIVE

## 2022-12-20 LAB — CBC
HCT: 40.4 % (ref 36.0–46.0)
Hemoglobin: 14.6 g/dL (ref 12.0–15.0)
MCH: 33 pg (ref 26.0–34.0)
MCHC: 36.1 g/dL — ABNORMAL HIGH (ref 30.0–36.0)
MCV: 91.4 fL (ref 80.0–100.0)
Platelets: 225 10*3/uL (ref 150–400)
RBC: 4.42 MIL/uL (ref 3.87–5.11)
RDW: 12 % (ref 11.5–15.5)
WBC: 19.4 10*3/uL — ABNORMAL HIGH (ref 4.0–10.5)
nRBC: 0 % (ref 0.0–0.2)

## 2022-12-20 LAB — TYPE AND SCREEN
ABO/RH(D): A POS
Antibody Screen: NEGATIVE

## 2022-12-20 MED ORDER — PHENYLEPHRINE 80 MCG/ML (10ML) SYRINGE FOR IV PUSH (FOR BLOOD PRESSURE SUPPORT): 80.0000 ug | PREFILLED_SYRINGE | INTRAVENOUS | Status: DC | PRN

## 2022-12-20 MED ORDER — OXYTOCIN BOLUS FROM INFUSION: 333.0000 mL | Freq: Once | INTRAVENOUS | Status: DC

## 2022-12-20 MED ORDER — LACTATED RINGERS IV SOLN: 500.0000 mL | INTRAVENOUS | Status: DC | PRN

## 2022-12-20 MED ORDER — DIPHENHYDRAMINE HCL 50 MG/ML IJ SOLN: 12.5000 mg | INTRAMUSCULAR | Status: DC | PRN

## 2022-12-20 MED ORDER — FENTANYL-BUPIVACAINE-NACL 0.5-0.125-0.9 MG/250ML-% EP SOLN: 12.0000 mL/h | EPIDURAL | Status: DC | PRN

## 2022-12-20 MED ORDER — EPHEDRINE 5 MG/ML INJ: 10.0000 mg | INTRAVENOUS | Status: DC | PRN

## 2022-12-20 MED ORDER — SOD CITRATE-CITRIC ACID 500-334 MG/5ML PO SOLN: 30.0000 mL | ORAL | Status: DC | PRN

## 2022-12-20 MED ORDER — LIDOCAINE HCL (PF) 1 % IJ SOLN
30.0000 mL | INTRAMUSCULAR | Status: AC | PRN
  Administered 2022-12-20: 30 mL via SUBCUTANEOUS
  Filled 2022-12-20: qty 30

## 2022-12-20 MED ORDER — LACTATED RINGERS IV SOLN: 500.0000 mL | Freq: Once | INTRAVENOUS | Status: DC

## 2022-12-20 MED ORDER — LACTATED RINGERS IV SOLN: INTRAVENOUS | Status: DC

## 2022-12-20 MED ORDER — OXYTOCIN-SODIUM CHLORIDE 30-0.9 UT/500ML-% IV SOLN: 2.5000 [IU]/h | INTRAVENOUS | Status: DC

## 2022-12-20 MED ORDER — ACETAMINOPHEN 325 MG PO TABS: 650.0000 mg | ORAL_TABLET | ORAL | Status: DC | PRN

## 2022-12-20 MED ORDER — ONDANSETRON HCL 4 MG/2ML IJ SOLN: 4.0000 mg | Freq: Four times a day (QID) | INTRAMUSCULAR | Status: DC | PRN

## 2022-12-20 NOTE — Progress Notes (Signed)
Pt and husband left AMA with baby. Baby was placed in car seat to leave.

## 2022-12-20 NOTE — MAU Provider Note (Signed)
  S: Ms. Judith Taylor is a 32 y.o. G1P0 at [redacted]w[redacted]d  who presents to MAU today complaining of leaking of fluid since 0300. She denies vaginal bleeding. She endorses contractions. She reports normal fetal movement.    Patient initiated prenatal care with [facility], last visit was 08/16/2022. She then voluntarily left the practice to pursue a home birth team. She is comfortable being managed by Faculty and is not requesting care from [facility].  Patient is agreeable to having labs collected but declines PIV placement  O: There were no vitals taken for this visit. GENERAL: Well-developed, well-nourished female in no acute distress.  HEAD: Normocephalic, atraumatic.  CHEST: Normal effort of breathing, regular heart rate ABDOMEN: Soft, nontender, gravid  Cervical exam:  Dilation: 7 Effacement (%): 90 Station: 0 Presentation: Vertex  Fetal Monitoring: Baseline: Indeterminate, interrupted tracing due to positioning Variability: Mod  Accelerations: 15 x 15 Decelerations: None Contractions: 2-3  A: SIUP at [redacted]w[redacted]d  Grossly ruptured on arrival, MSF Reassuring fetal tracing Dr Chestine Spore on call for [facility]. Confirms patient documented her departure from practice and patient is appropriate for management by Faculty  P: Admit to L&D, report given to Dr. Edrick Kins, MSA, MSN, CNM 12/20/2022 7:51 AM

## 2022-12-20 NOTE — Social Work (Signed)
CSW made aware by AC that MOB was signing AMA paperwork for her and the infant. CSW made CPS report to [place] CPS worker Michelle.   Laquiesha Piacente, LCSWA Clinical Social Worker [phone] 

## 2022-12-20 NOTE — MAU Note (Signed)
...  Judith Taylor is a 32 y.o. at [redacted]w[redacted]d here in MAU reporting: CTX that are 1-2 minutes apart. Patient reports her water broke at 0300 and had meconium that was "dark green." She reports since then she has continued to leak meconium but it is now a lighter green. Endorses bloody show.   Was planning an at home birth. Stopped prenatal care in December with [facility]. GBS unknown. Refuses IV access and reports she wants to be as hands off as possible. Patient okay with a blood draw for labs to be ran. [facility] notified. GBS swab collected with patient's consent by Flippin, RN.   Thalia Bloodgood, CNM, accompanied this RN with transport to L&D room 216.

## 2022-12-20 NOTE — H&P (Signed)
OBSTETRIC ADMISSION HISTORY AND PHYSICAL  Judith Taylor is a 32 y.o. female G1P0 with IUP at [redacted]w[redacted]d by LMP presenting for SROM and contractions. She reports +FMs, no VB, no blurry vision, headaches or peripheral edema, and RUQ pain.  She plans on breast feeding. She isn't requesting anything for birth control. She received her prenatal care at [facility] but voluntarily left the practice to pursue a home birth team.  Dating: By LMP --->  Estimated Date of Delivery: 12/11/22  Sono:    , CWD, normal anatomy, vtx presentation, 11oz, nl EFW, post placenta, 3VC   Prenatal History/Complications:  -Anxiety, depression on Lamictal, Buspar, Wellbutrin  Past Medical History: No past medical history on file.  Past Surgical History: No past surgical history on file.  Obstetrical History: OB History     Gravida  1   Para      Term      Preterm      AB      Living         SAB      IAB      Ectopic      Multiple      Live Births              Social History Social History   Socioeconomic History   Marital status: Married    Spouse name: Not on file   Number of children: Not on file   Years of education: Not on file   Highest education level: Not on file  Occupational History   Not on file  Tobacco Use   Smoking status: Never   Smokeless tobacco: Never  Substance and Sexual Activity   Alcohol use: Yes   Drug use: Not on file   Sexual activity: Not on file  Other Topics Concern   Not on file  Social History Narrative   Not on file   Social Determinants of Health   Financial Resource Strain: Not on file  Food Insecurity: Not on file  Transportation Needs: Not on file  Physical Activity: Not on file  Stress: Not on file  Social Connections: Not on file    Family History: No family history on file.  Allergies: No Known Allergies  Medications Prior to Admission  Medication Sig Dispense Refill Last Dose   ibuprofen (ADVIL,MOTRIN)  200 MG tablet Take 200 mg by mouth every 6 (six) hours as needed for headache or moderate pain.      Norethin Ace-Eth Estrad-FE (MINASTRIN 24 FE) 1-20 MG-MCG(24) CHEW Chew 1 tablet by mouth daily.      ondansetron (ZOFRAN) 8 MG tablet Take 1 tablet (8 mg total) by mouth every 8 (eight) hours as needed for nausea or vomiting. 20 tablet 0    oxyCODONE-acetaminophen (PERCOCET) 7.5-325 MG tablet Take 1 tablet by mouth every 6 (six) hours as needed for severe pain. 12 tablet 0      Review of Systems   All systems reviewed and negative except as stated in HPI  SpO2 100 %. General appearance: cooperative and fatigued Lungs: clear to auscultation bilaterally Heart: regular rate and rhythm Abdomen: soft, non-tender; bowel sounds normal Extremities: Homans sign is negative, no sign of DVT Fetal monitoring IA 150s, +variability Uterine activityq 2-4 mins Dilation: 7 Effacement (%): 90 Station: 0   Prenatal labs: (all from 05/17/22- media) ABO, Rh:  A+ Antibody:  neg Rubella:   Immune  RPR:  NR  HBsAg:   Negative  HIV:   NR  GBS:   unk @ present (culture collected 12/20/22) 1 hr Glucola : not available Genetic screening  not available Anatomy US nl  Prenatal Transfer Tool  Maternal Diabetes: No Genetic Screening: Declined Maternal Ultrasounds/Referrals: Normal 2nd trimester, report not available Fetal Ultrasounds or other Referrals:  None Maternal Substance Abuse:  No Significant Maternal Medications:  None Significant Maternal Lab Results:  None GBS pending Number of Prenatal Visits:greater than 3 verified prenatal visits Other Comments:  None  No results found for this or any previous visit (from the past 24 hour(s)).  Patient Active Problem List   Diagnosis Date Noted   Normal labor 12/20/2022    Assessment/Plan:  Judith Taylor is a 32 y.o. G1P0 at [redacted]w[redacted]d here for SOL  #Labor:Expectant management #Pain: Per patient #ID:  GBS unknown (culture collected  12/20/22) #MOF: breast #MOC:declines #Circ:  unsure  Vonna Drafts, MD  12/20/2022, 8:45 AM  CNM attestation:  I have seen and examined this patient; I agree with above documentation in the resident's note.   Judith Taylor is a 32 y.o. G1P0 here for SOL; had been laboring at home with home birth team; initially had [facility] w [facility] until 23 weeks and then voluntarily left to pursue home birth care.  PE: SpO2 100%  Gen: nl labor distress Resp: normal effort Abd: gravid  ROS, labs, PMH reviewed  Plan: Admit to Labor & Delivery Expectant management Declines IV and IA as frequently as we would want per protocol GBS culture pending from MAU Anticipate vag del  Judith Taylor CNM 12/20/2022, 9:13 AM

## 2022-12-20 NOTE — Discharge Summary (Signed)
     Postpartum Discharge Summary     Patient Name: Judith Taylor DOB: [birth year] MRN: [id number]  Date of admission: 12/20/2022 Delivery date:12/20/2022  Delivering provider: Cam Hai D  Date of discharge: 12/20/2022  Admitting diagnosis: Normal labor [O80, Z37.9] Intrauterine pregnancy: 101w2d     Secondary diagnosis:  Principal Problem:   Normal labor  Additional problems: GBS unknown (pending)    Discharge diagnosis: Term Pregnancy Delivered                                              Post partum procedures: none Augmentation:  none Complications: None  Hospital course: Onset of Labor With Vaginal Delivery      31 y.o. yo G1P0 at [redacted]w[redacted]d was admitted in Active Labor on 12/20/2022. She had been planning on a home birth until SROM of light MSF; came to the hospital out of concern. Labor course continued to progress spontaneously and was uncomplicated.  Membrane Rupture Time/Date: 3:00 AM ,12/20/2022   Delivery Method:Vaginal, Spontaneous  Episiotomy: None  Lacerations:  None  Patient had an uncomplicated very brief postpartum course complicated by leaving AMA <6hrs from birth on 12/20/2022.  She is ambulating, tolerating a regular diet, passing flatus, and urinating well.   Newborn Data: Birth date:12/20/2022  Birth time:10:20 AM  Gender:Female  Living status:Living  Apgars:9 ,9  Weight:3330 g  (7lb 5.5oz)  Magnesium Sulfate received: No BMZ received: No Rhophylac:N/A MMR:N/A T-DaP: offered PP Flu: No Transfusion:No  Physical exam  Vitals:   12/20/22 0733 12/20/22 0738  SpO2: 99% 100%   General: alert and cooperative Lochia: appropriate Uterine Fundus: firm Incision: N/A DVT Evaluation: No evidence of DVT seen on physical exam. Labs: Lab Results  Component Value Date   WBC 19.4 (H) 12/20/2022   HGB 14.6 12/20/2022   HCT 40.4 12/20/2022   MCV 91.4 12/20/2022   PLT 225 12/20/2022       No data to display         [place] Score:     No  data to display           Discharge home in stable condition Infant Feeding: Breast Infant Disposition:home with mother Discharge instruction: per After Visit Summary and Postpartum booklet. Activity: Advance as tolerated. Pelvic rest for 6 weeks.  Diet: routine diet Future Appointments:No future appointments. Follow up Visit:  [name], CNM  P [facility] Admin Pool (She was receiving care by a homebirth provider; pls offer a PP appt though she may not accept)  Please schedule this patient for Postpartum visit in: 6 weeks with the following provider: Any provider In-Person For C/S patients schedule nurse incision check in weeks 2 weeks: no Low risk pregnancy complicated by: limited [facility] Delivery mode:  SVD Anticipated Birth Control:  other/unsure PP Procedures needed: none Schedule Integrated BH visit: no   12/20/2022 [name], CNM

## 2022-12-20 NOTE — Plan of Care (Signed)
AMA papers signed for patient and baby

## 2022-12-21 LAB — CULTURE, BETA STREP (GROUP B ONLY)

## 2022-12-22 LAB — CULTURE, BETA STREP (GROUP B ONLY)

## 2022-12-26 ENCOUNTER — Telehealth: Payer: Self-pay | Admitting: Family Medicine

## 2022-12-26 DIAGNOSIS — F331 Major depressive disorder, recurrent, moderate: Secondary | ICD-10-CM | POA: Diagnosis not present

## 2022-12-26 DIAGNOSIS — F411 Generalized anxiety disorder: Secondary | ICD-10-CM | POA: Diagnosis not present

## 2022-12-26 NOTE — Telephone Encounter (Signed)
Patient was not getting here prenatal care here, we received a message from MAU to get her scheduled here, patient have some questions and concerns and want to speak with a nurse

## 2022-12-27 DIAGNOSIS — F3181 Bipolar II disorder: Secondary | ICD-10-CM | POA: Diagnosis not present

## 2022-12-27 DIAGNOSIS — F411 Generalized anxiety disorder: Secondary | ICD-10-CM | POA: Diagnosis not present

## 2022-12-27 NOTE — Telephone Encounter (Signed)
Returned patients call. She did not answer. LM for patient to call the office with any further questions or concerns as needed.

## 2023-01-02 ENCOUNTER — Telehealth (HOSPITAL_COMMUNITY): Payer: Self-pay | Admitting: *Deleted

## 2023-01-02 NOTE — Telephone Encounter (Signed)
Left phone voicemail message.  Duffy Rhody, RN 01-02-2023 at [facility]

## 2023-01-03 DIAGNOSIS — F3181 Bipolar II disorder: Secondary | ICD-10-CM | POA: Diagnosis not present

## 2023-01-03 DIAGNOSIS — F411 Generalized anxiety disorder: Secondary | ICD-10-CM | POA: Diagnosis not present

## 2023-01-23 ENCOUNTER — Ambulatory Visit: Payer: BLUE CROSS/BLUE SHIELD | Admitting: Obstetrics & Gynecology

## 2023-01-23 DIAGNOSIS — F331 Major depressive disorder, recurrent, moderate: Secondary | ICD-10-CM | POA: Diagnosis not present

## 2023-01-23 DIAGNOSIS — F411 Generalized anxiety disorder: Secondary | ICD-10-CM | POA: Diagnosis not present

## 2023-01-27 DIAGNOSIS — F411 Generalized anxiety disorder: Secondary | ICD-10-CM | POA: Diagnosis not present

## 2023-01-27 DIAGNOSIS — F3181 Bipolar II disorder: Secondary | ICD-10-CM | POA: Diagnosis not present

## 2023-01-31 DIAGNOSIS — N8189 Other female genital prolapse: Secondary | ICD-10-CM | POA: Diagnosis not present

## 2023-02-01 DIAGNOSIS — R102 Pelvic and perineal pain: Secondary | ICD-10-CM | POA: Diagnosis not present

## 2023-02-01 DIAGNOSIS — N819 Female genital prolapse, unspecified: Secondary | ICD-10-CM | POA: Diagnosis not present

## 2023-02-01 DIAGNOSIS — R278 Other lack of coordination: Secondary | ICD-10-CM | POA: Diagnosis not present

## 2023-02-01 DIAGNOSIS — M6281 Muscle weakness (generalized): Secondary | ICD-10-CM | POA: Diagnosis not present

## 2023-02-10 DIAGNOSIS — N644 Mastodynia: Secondary | ICD-10-CM | POA: Diagnosis not present

## 2023-02-10 DIAGNOSIS — Z789 Other specified health status: Secondary | ICD-10-CM | POA: Diagnosis not present

## 2023-02-10 DIAGNOSIS — K602 Anal fissure, unspecified: Secondary | ICD-10-CM | POA: Diagnosis not present

## 2023-02-14 DIAGNOSIS — M6281 Muscle weakness (generalized): Secondary | ICD-10-CM | POA: Diagnosis not present

## 2023-02-14 DIAGNOSIS — R278 Other lack of coordination: Secondary | ICD-10-CM | POA: Diagnosis not present

## 2023-02-14 DIAGNOSIS — R102 Pelvic and perineal pain: Secondary | ICD-10-CM | POA: Diagnosis not present

## 2023-02-14 DIAGNOSIS — N819 Female genital prolapse, unspecified: Secondary | ICD-10-CM | POA: Diagnosis not present

## 2023-02-22 DIAGNOSIS — R102 Pelvic and perineal pain: Secondary | ICD-10-CM | POA: Diagnosis not present

## 2023-02-22 DIAGNOSIS — M6281 Muscle weakness (generalized): Secondary | ICD-10-CM | POA: Diagnosis not present

## 2023-02-22 DIAGNOSIS — F331 Major depressive disorder, recurrent, moderate: Secondary | ICD-10-CM | POA: Diagnosis not present

## 2023-02-22 DIAGNOSIS — N819 Female genital prolapse, unspecified: Secondary | ICD-10-CM | POA: Diagnosis not present

## 2023-02-22 DIAGNOSIS — F411 Generalized anxiety disorder: Secondary | ICD-10-CM | POA: Diagnosis not present

## 2023-02-22 DIAGNOSIS — N898 Other specified noninflammatory disorders of vagina: Secondary | ICD-10-CM | POA: Diagnosis not present

## 2023-02-22 DIAGNOSIS — R278 Other lack of coordination: Secondary | ICD-10-CM | POA: Diagnosis not present

## 2023-02-23 DIAGNOSIS — F3181 Bipolar II disorder: Secondary | ICD-10-CM | POA: Diagnosis not present

## 2023-02-23 DIAGNOSIS — F411 Generalized anxiety disorder: Secondary | ICD-10-CM | POA: Diagnosis not present

## 2023-02-25 DIAGNOSIS — R21 Rash and other nonspecific skin eruption: Secondary | ICD-10-CM | POA: Diagnosis not present

## 2023-02-27 DIAGNOSIS — F411 Generalized anxiety disorder: Secondary | ICD-10-CM | POA: Diagnosis not present

## 2023-02-27 DIAGNOSIS — F331 Major depressive disorder, recurrent, moderate: Secondary | ICD-10-CM | POA: Diagnosis not present

## 2023-03-02 DIAGNOSIS — R278 Other lack of coordination: Secondary | ICD-10-CM | POA: Diagnosis not present

## 2023-03-02 DIAGNOSIS — N819 Female genital prolapse, unspecified: Secondary | ICD-10-CM | POA: Diagnosis not present

## 2023-03-02 DIAGNOSIS — R102 Pelvic and perineal pain: Secondary | ICD-10-CM | POA: Diagnosis not present

## 2023-03-02 DIAGNOSIS — M6281 Muscle weakness (generalized): Secondary | ICD-10-CM | POA: Diagnosis not present

## 2023-03-03 DIAGNOSIS — F411 Generalized anxiety disorder: Secondary | ICD-10-CM | POA: Diagnosis not present

## 2023-03-03 DIAGNOSIS — F3181 Bipolar II disorder: Secondary | ICD-10-CM | POA: Diagnosis not present

## 2023-03-06 DIAGNOSIS — R278 Other lack of coordination: Secondary | ICD-10-CM | POA: Diagnosis not present

## 2023-03-06 DIAGNOSIS — M6281 Muscle weakness (generalized): Secondary | ICD-10-CM | POA: Diagnosis not present

## 2023-03-06 DIAGNOSIS — N819 Female genital prolapse, unspecified: Secondary | ICD-10-CM | POA: Diagnosis not present

## 2023-03-06 DIAGNOSIS — R102 Pelvic and perineal pain: Secondary | ICD-10-CM | POA: Diagnosis not present

## 2023-03-13 DIAGNOSIS — F331 Major depressive disorder, recurrent, moderate: Secondary | ICD-10-CM | POA: Diagnosis not present

## 2023-03-13 DIAGNOSIS — F411 Generalized anxiety disorder: Secondary | ICD-10-CM | POA: Diagnosis not present

## 2023-03-14 DIAGNOSIS — M6281 Muscle weakness (generalized): Secondary | ICD-10-CM | POA: Diagnosis not present

## 2023-03-14 DIAGNOSIS — N819 Female genital prolapse, unspecified: Secondary | ICD-10-CM | POA: Diagnosis not present

## 2023-03-14 DIAGNOSIS — R278 Other lack of coordination: Secondary | ICD-10-CM | POA: Diagnosis not present

## 2023-03-14 DIAGNOSIS — R102 Pelvic and perineal pain: Secondary | ICD-10-CM | POA: Diagnosis not present

## 2023-03-21 DIAGNOSIS — R102 Pelvic and perineal pain: Secondary | ICD-10-CM | POA: Diagnosis not present

## 2023-03-21 DIAGNOSIS — N819 Female genital prolapse, unspecified: Secondary | ICD-10-CM | POA: Diagnosis not present

## 2023-03-21 DIAGNOSIS — M6281 Muscle weakness (generalized): Secondary | ICD-10-CM | POA: Diagnosis not present

## 2023-03-21 DIAGNOSIS — R278 Other lack of coordination: Secondary | ICD-10-CM | POA: Diagnosis not present

## 2023-03-24 DIAGNOSIS — F411 Generalized anxiety disorder: Secondary | ICD-10-CM | POA: Diagnosis not present

## 2023-03-24 DIAGNOSIS — F3181 Bipolar II disorder: Secondary | ICD-10-CM | POA: Diagnosis not present

## 2023-03-28 DIAGNOSIS — N819 Female genital prolapse, unspecified: Secondary | ICD-10-CM | POA: Diagnosis not present

## 2023-03-28 DIAGNOSIS — R278 Other lack of coordination: Secondary | ICD-10-CM | POA: Diagnosis not present

## 2023-03-28 DIAGNOSIS — M6281 Muscle weakness (generalized): Secondary | ICD-10-CM | POA: Diagnosis not present

## 2023-03-28 DIAGNOSIS — R102 Pelvic and perineal pain: Secondary | ICD-10-CM | POA: Diagnosis not present

## 2023-04-04 DIAGNOSIS — M6281 Muscle weakness (generalized): Secondary | ICD-10-CM | POA: Diagnosis not present

## 2023-04-04 DIAGNOSIS — N819 Female genital prolapse, unspecified: Secondary | ICD-10-CM | POA: Diagnosis not present

## 2023-04-04 DIAGNOSIS — R278 Other lack of coordination: Secondary | ICD-10-CM | POA: Diagnosis not present

## 2023-04-04 DIAGNOSIS — R102 Pelvic and perineal pain: Secondary | ICD-10-CM | POA: Diagnosis not present

## 2023-04-05 DIAGNOSIS — F411 Generalized anxiety disorder: Secondary | ICD-10-CM | POA: Diagnosis not present

## 2023-04-05 DIAGNOSIS — F331 Major depressive disorder, recurrent, moderate: Secondary | ICD-10-CM | POA: Diagnosis not present

## 2023-04-11 DIAGNOSIS — R102 Pelvic and perineal pain: Secondary | ICD-10-CM | POA: Diagnosis not present

## 2023-04-11 DIAGNOSIS — M6281 Muscle weakness (generalized): Secondary | ICD-10-CM | POA: Diagnosis not present

## 2023-04-11 DIAGNOSIS — N819 Female genital prolapse, unspecified: Secondary | ICD-10-CM | POA: Diagnosis not present

## 2023-04-11 DIAGNOSIS — R278 Other lack of coordination: Secondary | ICD-10-CM | POA: Diagnosis not present

## 2023-04-14 DIAGNOSIS — F3181 Bipolar II disorder: Secondary | ICD-10-CM | POA: Diagnosis not present

## 2023-04-14 DIAGNOSIS — F411 Generalized anxiety disorder: Secondary | ICD-10-CM | POA: Diagnosis not present

## 2023-04-18 DIAGNOSIS — R278 Other lack of coordination: Secondary | ICD-10-CM | POA: Diagnosis not present

## 2023-04-18 DIAGNOSIS — R102 Pelvic and perineal pain: Secondary | ICD-10-CM | POA: Diagnosis not present

## 2023-04-18 DIAGNOSIS — N819 Female genital prolapse, unspecified: Secondary | ICD-10-CM | POA: Diagnosis not present

## 2023-04-18 DIAGNOSIS — M6281 Muscle weakness (generalized): Secondary | ICD-10-CM | POA: Diagnosis not present

## 2023-04-25 DIAGNOSIS — M6281 Muscle weakness (generalized): Secondary | ICD-10-CM | POA: Diagnosis not present

## 2023-04-25 DIAGNOSIS — N819 Female genital prolapse, unspecified: Secondary | ICD-10-CM | POA: Diagnosis not present

## 2023-04-25 DIAGNOSIS — R102 Pelvic and perineal pain: Secondary | ICD-10-CM | POA: Diagnosis not present

## 2023-04-25 DIAGNOSIS — R278 Other lack of coordination: Secondary | ICD-10-CM | POA: Diagnosis not present

## 2023-05-02 DIAGNOSIS — R102 Pelvic and perineal pain: Secondary | ICD-10-CM | POA: Diagnosis not present

## 2023-05-02 DIAGNOSIS — R278 Other lack of coordination: Secondary | ICD-10-CM | POA: Diagnosis not present

## 2023-05-02 DIAGNOSIS — M6281 Muscle weakness (generalized): Secondary | ICD-10-CM | POA: Diagnosis not present

## 2023-05-02 DIAGNOSIS — N819 Female genital prolapse, unspecified: Secondary | ICD-10-CM | POA: Diagnosis not present

## 2023-05-05 DIAGNOSIS — F331 Major depressive disorder, recurrent, moderate: Secondary | ICD-10-CM | POA: Diagnosis not present

## 2023-05-05 DIAGNOSIS — F411 Generalized anxiety disorder: Secondary | ICD-10-CM | POA: Diagnosis not present

## 2023-05-10 DIAGNOSIS — N819 Female genital prolapse, unspecified: Secondary | ICD-10-CM | POA: Diagnosis not present

## 2023-05-10 DIAGNOSIS — R278 Other lack of coordination: Secondary | ICD-10-CM | POA: Diagnosis not present

## 2023-05-10 DIAGNOSIS — M6281 Muscle weakness (generalized): Secondary | ICD-10-CM | POA: Diagnosis not present

## 2023-05-10 DIAGNOSIS — R102 Pelvic and perineal pain: Secondary | ICD-10-CM | POA: Diagnosis not present

## 2023-05-15 DIAGNOSIS — F3181 Bipolar II disorder: Secondary | ICD-10-CM | POA: Diagnosis not present

## 2023-05-15 DIAGNOSIS — F411 Generalized anxiety disorder: Secondary | ICD-10-CM | POA: Diagnosis not present

## 2023-05-17 DIAGNOSIS — N819 Female genital prolapse, unspecified: Secondary | ICD-10-CM | POA: Diagnosis not present

## 2023-05-17 DIAGNOSIS — M6281 Muscle weakness (generalized): Secondary | ICD-10-CM | POA: Diagnosis not present

## 2023-05-17 DIAGNOSIS — R102 Pelvic and perineal pain: Secondary | ICD-10-CM | POA: Diagnosis not present

## 2023-05-17 DIAGNOSIS — R278 Other lack of coordination: Secondary | ICD-10-CM | POA: Diagnosis not present

## 2023-05-24 DIAGNOSIS — M6281 Muscle weakness (generalized): Secondary | ICD-10-CM | POA: Diagnosis not present

## 2023-05-24 DIAGNOSIS — R102 Pelvic and perineal pain: Secondary | ICD-10-CM | POA: Diagnosis not present

## 2023-05-24 DIAGNOSIS — R278 Other lack of coordination: Secondary | ICD-10-CM | POA: Diagnosis not present

## 2023-05-24 DIAGNOSIS — N819 Female genital prolapse, unspecified: Secondary | ICD-10-CM | POA: Diagnosis not present

## 2023-05-31 DIAGNOSIS — R102 Pelvic and perineal pain: Secondary | ICD-10-CM | POA: Diagnosis not present

## 2023-05-31 DIAGNOSIS — R278 Other lack of coordination: Secondary | ICD-10-CM | POA: Diagnosis not present

## 2023-05-31 DIAGNOSIS — M6281 Muscle weakness (generalized): Secondary | ICD-10-CM | POA: Diagnosis not present

## 2023-05-31 DIAGNOSIS — N819 Female genital prolapse, unspecified: Secondary | ICD-10-CM | POA: Diagnosis not present

## 2023-06-08 DIAGNOSIS — F411 Generalized anxiety disorder: Secondary | ICD-10-CM | POA: Diagnosis not present

## 2023-06-08 DIAGNOSIS — F3181 Bipolar II disorder: Secondary | ICD-10-CM | POA: Diagnosis not present

## 2023-06-13 DIAGNOSIS — R102 Pelvic and perineal pain: Secondary | ICD-10-CM | POA: Diagnosis not present

## 2023-06-13 DIAGNOSIS — N819 Female genital prolapse, unspecified: Secondary | ICD-10-CM | POA: Diagnosis not present

## 2023-06-13 DIAGNOSIS — R278 Other lack of coordination: Secondary | ICD-10-CM | POA: Diagnosis not present

## 2023-06-13 DIAGNOSIS — M6281 Muscle weakness (generalized): Secondary | ICD-10-CM | POA: Diagnosis not present

## 2023-06-20 DIAGNOSIS — M6281 Muscle weakness (generalized): Secondary | ICD-10-CM | POA: Diagnosis not present

## 2023-06-20 DIAGNOSIS — N819 Female genital prolapse, unspecified: Secondary | ICD-10-CM | POA: Diagnosis not present

## 2023-06-20 DIAGNOSIS — R278 Other lack of coordination: Secondary | ICD-10-CM | POA: Diagnosis not present

## 2023-06-20 DIAGNOSIS — R102 Pelvic and perineal pain: Secondary | ICD-10-CM | POA: Diagnosis not present

## 2023-06-26 DIAGNOSIS — F331 Major depressive disorder, recurrent, moderate: Secondary | ICD-10-CM | POA: Diagnosis not present

## 2023-06-26 DIAGNOSIS — F411 Generalized anxiety disorder: Secondary | ICD-10-CM | POA: Diagnosis not present

## 2023-07-04 DIAGNOSIS — R102 Pelvic and perineal pain: Secondary | ICD-10-CM | POA: Diagnosis not present

## 2023-07-04 DIAGNOSIS — N819 Female genital prolapse, unspecified: Secondary | ICD-10-CM | POA: Diagnosis not present

## 2023-07-04 DIAGNOSIS — M6281 Muscle weakness (generalized): Secondary | ICD-10-CM | POA: Diagnosis not present

## 2023-07-04 DIAGNOSIS — R278 Other lack of coordination: Secondary | ICD-10-CM | POA: Diagnosis not present

## 2023-07-05 DIAGNOSIS — A499 Bacterial infection, unspecified: Secondary | ICD-10-CM | POA: Diagnosis not present

## 2023-07-10 DIAGNOSIS — F411 Generalized anxiety disorder: Secondary | ICD-10-CM | POA: Diagnosis not present

## 2023-07-10 DIAGNOSIS — R102 Pelvic and perineal pain: Secondary | ICD-10-CM | POA: Diagnosis not present

## 2023-07-10 DIAGNOSIS — F3181 Bipolar II disorder: Secondary | ICD-10-CM | POA: Diagnosis not present

## 2023-07-10 DIAGNOSIS — M6281 Muscle weakness (generalized): Secondary | ICD-10-CM | POA: Diagnosis not present

## 2023-07-10 DIAGNOSIS — N819 Female genital prolapse, unspecified: Secondary | ICD-10-CM | POA: Diagnosis not present

## 2023-07-10 DIAGNOSIS — R278 Other lack of coordination: Secondary | ICD-10-CM | POA: Diagnosis not present

## 2023-07-21 DIAGNOSIS — F411 Generalized anxiety disorder: Secondary | ICD-10-CM | POA: Diagnosis not present

## 2023-07-21 DIAGNOSIS — F3181 Bipolar II disorder: Secondary | ICD-10-CM | POA: Diagnosis not present

## 2023-08-11 ENCOUNTER — Other Ambulatory Visit: Payer: Self-pay | Admitting: Family Medicine

## 2023-08-11 DIAGNOSIS — N63 Unspecified lump in unspecified breast: Secondary | ICD-10-CM

## 2023-08-11 DIAGNOSIS — N644 Mastodynia: Secondary | ICD-10-CM | POA: Diagnosis not present

## 2023-08-11 DIAGNOSIS — J329 Chronic sinusitis, unspecified: Secondary | ICD-10-CM | POA: Diagnosis not present

## 2023-08-11 DIAGNOSIS — R059 Cough, unspecified: Secondary | ICD-10-CM | POA: Diagnosis not present

## 2023-09-01 ENCOUNTER — Ambulatory Visit: Payer: BLUE CROSS/BLUE SHIELD

## 2023-09-01 ENCOUNTER — Ambulatory Visit
Admission: RE | Admit: 2023-09-01 | Discharge: 2023-09-01 | Disposition: A | Discharge status: Home / Self Care | Payer: BLUE CROSS/BLUE SHIELD | Source: Ambulatory Visit | Attending: Family Medicine | Admitting: Family Medicine

## 2023-09-01 DIAGNOSIS — N63 Unspecified lump in unspecified breast: Secondary | ICD-10-CM

## 2023-09-01 DIAGNOSIS — N644 Mastodynia: Secondary | ICD-10-CM

## 2023-09-29 ENCOUNTER — Encounter: Payer: Self-pay | Admitting: Oral Surgery

## 2023-10-02 ENCOUNTER — Encounter: Payer: Self-pay | Admitting: Oral Surgery

## 2023-10-03 ENCOUNTER — Other Ambulatory Visit: Payer: Self-pay | Admitting: Oral Surgery

## 2023-10-03 DIAGNOSIS — M26603 Bilateral temporomandibular joint disorder, unspecified: Secondary | ICD-10-CM

## 2023-10-03 DIAGNOSIS — M26643 Arthritis of bilateral temporomandibular joint: Secondary | ICD-10-CM

## 2023-10-18 ENCOUNTER — Ambulatory Visit
Admission: RE | Admit: 2023-10-18 | Discharge: 2023-10-18 | Disposition: A | Discharge status: Home / Self Care | Payer: Commercial Managed Care - PPO | Source: Ambulatory Visit | Attending: Oral Surgery | Admitting: Oral Surgery

## 2023-10-18 DIAGNOSIS — M26603 Bilateral temporomandibular joint disorder, unspecified: Secondary | ICD-10-CM

## 2023-10-18 DIAGNOSIS — M26643 Arthritis of bilateral temporomandibular joint: Secondary | ICD-10-CM

## 2023-12-10 IMAGING — MR MR [PERSON_NAME]
11 series · 48 of 48 positions shown · non-contrast
Comparison: None.

CLINICAL DATA: Articular disc disorder.

28 mm bite block. Pt removed 2 tongue depressors, complains of jaw
pain with limited opening, facial pain, ear and neck pain x 2+ yrs
EXAM:
MRI OF TEMPOROMANDIBULAR JOINT WITHOUT CONTRAST
TECHNIQUE: Multiplanar, multisequence MR imaging of the temporomandibular joint
was performed following the standard protocol. No intravenous
contrast was administered.

[Series 5: T1 · axial · 4.0mm · 0.59mm/px · z∈[-30,+18]mm · 5 of 13 slices shown]
[im 1/13]
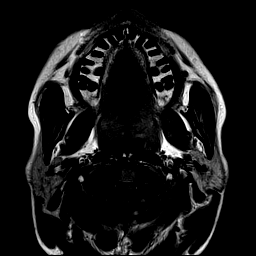
[im 4/13]
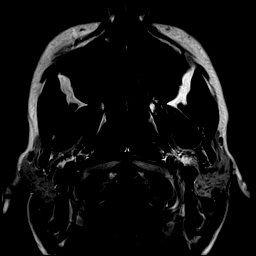
[im 7/13]
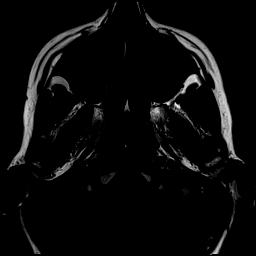
[im 10/13]
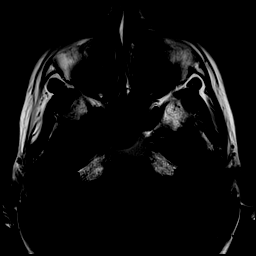
[im 13/13]
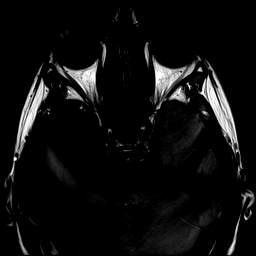

[Series 6: T2 fat-sat · sagittal · 4.0mm · 0.62mm/px · 4 of 11 slices shown (1 of 2)]
[im 1/11]
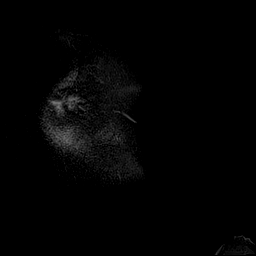
[im 4/11]
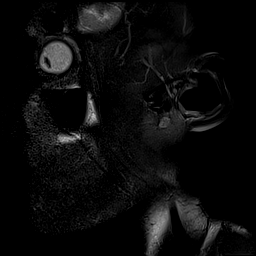
[im 7/11]
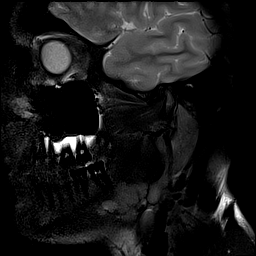
[im 11/11]
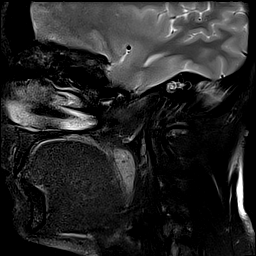

[Series 7: T2 fat-sat · sagittal · 4.0mm · 0.62mm/px · 4 of 11 slices shown (2 of 2)]
[im 1/11]
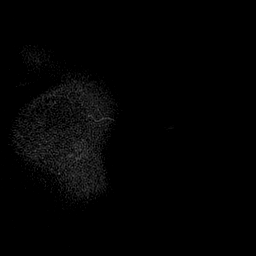
[im 4/11]
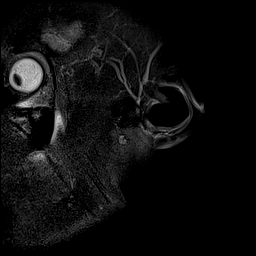
[im 7/11]
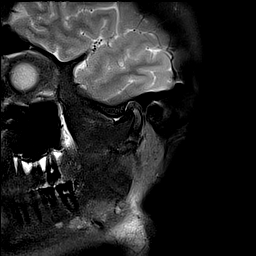
[im 11/11]
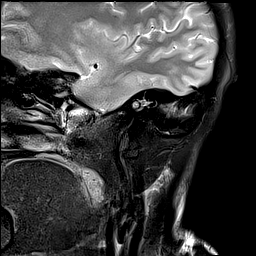

[Series 8: PD · coronal · 4.0mm · 0.44mm/px · 5 of 13 slices shown (1 of 8)]
[im 1/13]
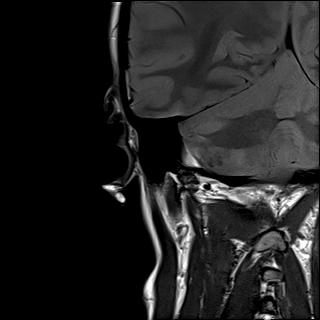
[im 4/13]
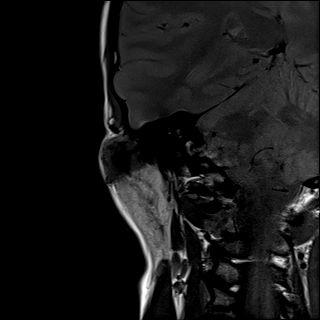
[im 7/13]
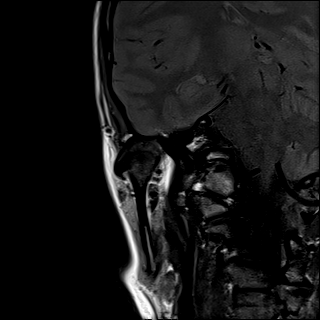
[im 10/13]
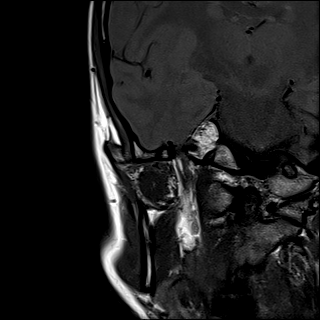
[im 13/13]
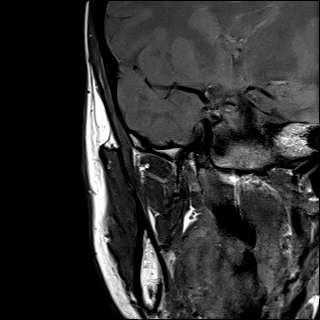

[Series 9: PD · coronal · 4.0mm · 0.44mm/px · 5 of 13 slices shown (2 of 8)]
[im 1/13]
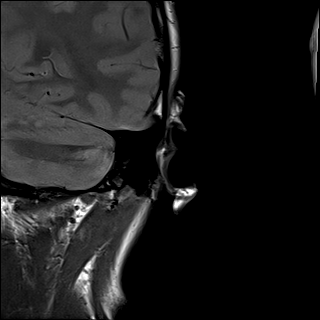
[im 4/13]
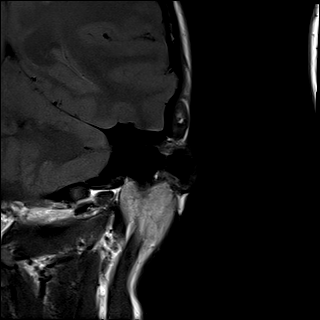
[im 7/13]
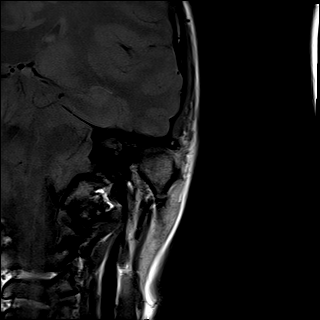
[im 10/13]
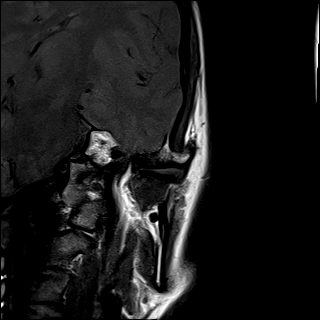
[im 13/13]
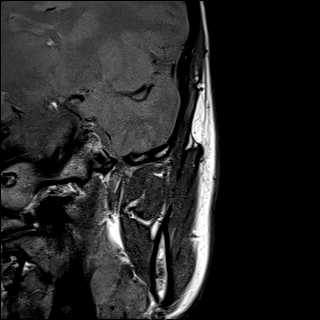

[Series 10: PD · sagittal · 4.0mm · 0.44mm/px · 4 of 11 slices shown (3 of 8)]
[im 1/11]
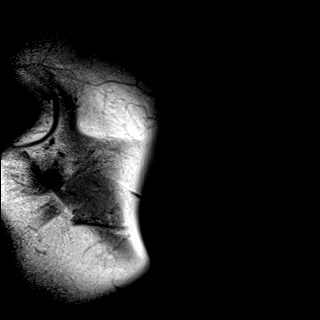
[im 4/11]
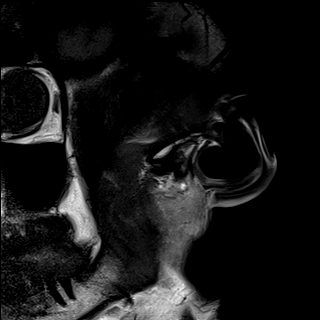
[im 7/11]
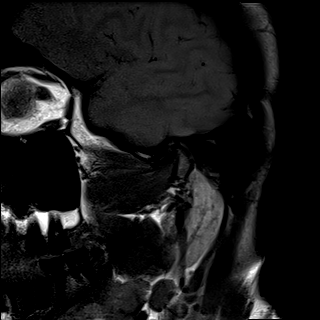
[im 11/11]
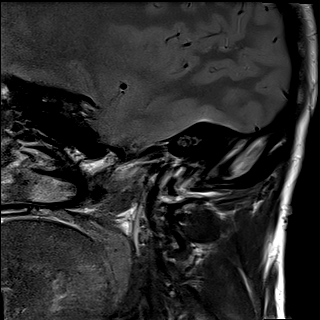

[Series 11: PD · sagittal · 4.0mm · 0.44mm/px · 3 of 9 slices shown (4 of 8)]
[im 1/9]
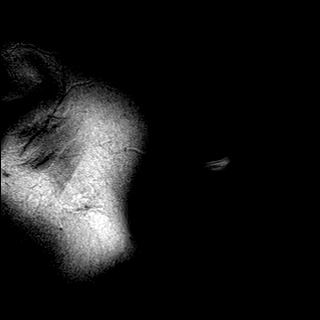
[im 5/9]
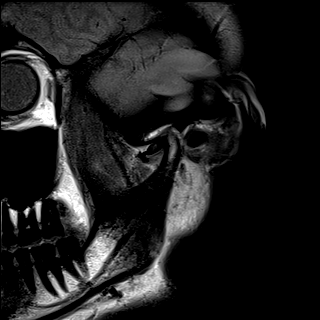
[im 9/9]
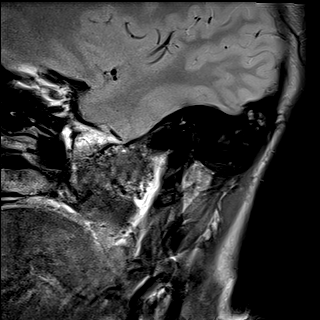

[Series 13: PD · sagittal · 4.0mm · 0.44mm/px · 4 of 11 slices shown (5 of 8)]
[im 1/11]
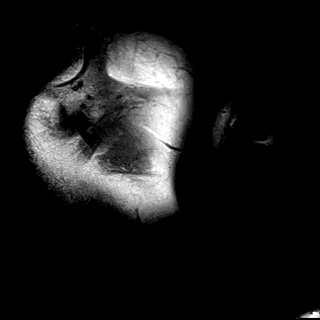
[im 4/11]
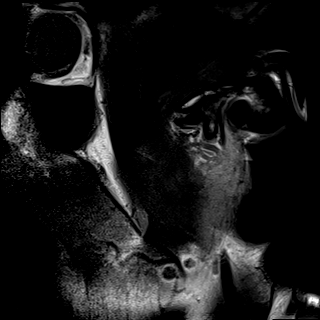
[im 7/11]
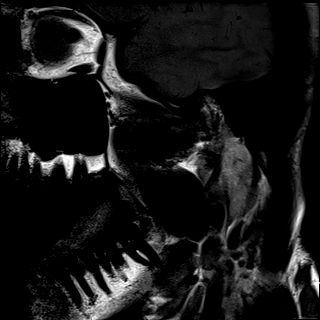
[im 11/11]
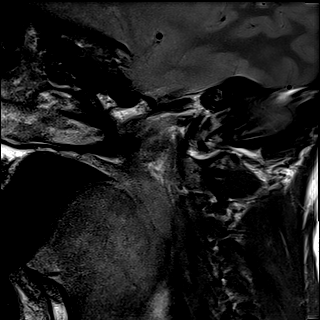

[Series 14: PD · sagittal · 4.0mm · 0.44mm/px · 4 of 11 slices shown (6 of 8)]
[im 1/11]
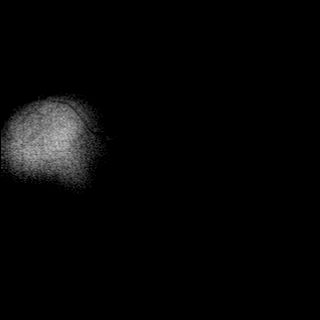
[im 4/11]
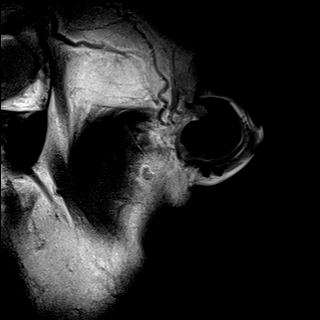
[im 7/11]
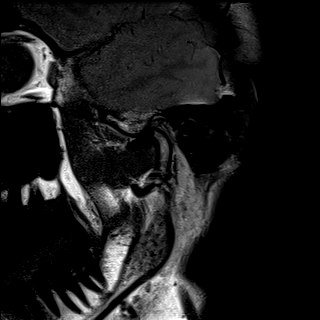
[im 11/11]
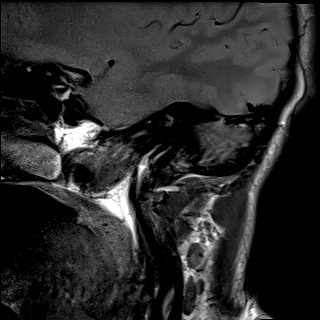

[Series 15: PD · coronal · 4.0mm · 0.44mm/px · 5 of 13 slices shown (7 of 8)]
[im 1/13]
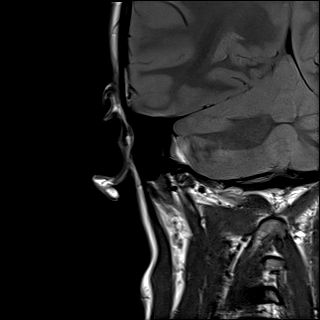
[im 4/13]
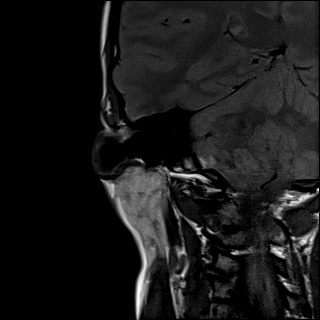
[im 7/13]
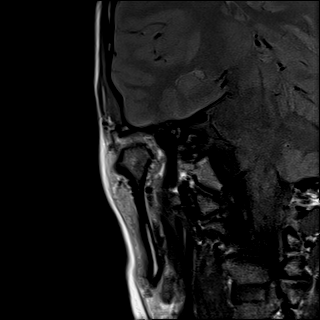
[im 10/13]
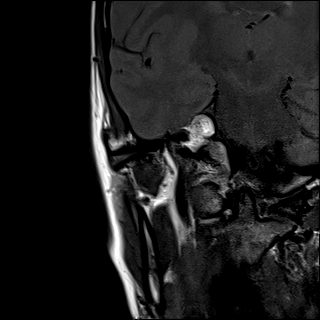
[im 13/13]
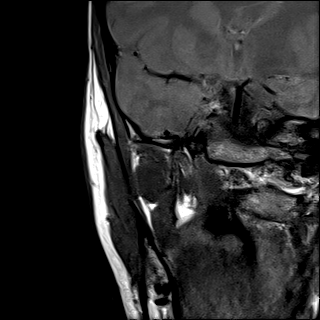

[Series 16: PD · coronal · 4.0mm · 0.44mm/px · 5 of 13 slices shown (8 of 8)]
[im 1/13]
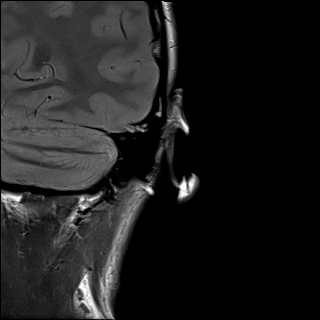
[im 4/13]
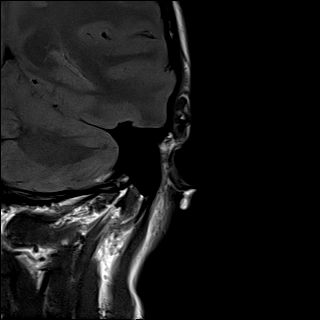
[im 7/13]
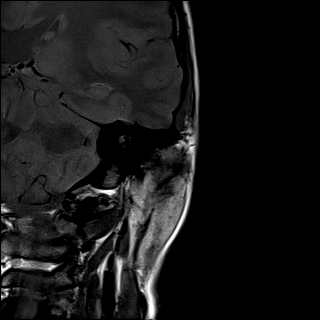
[im 10/13]
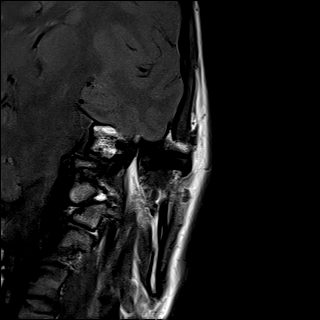
[im 13/13]
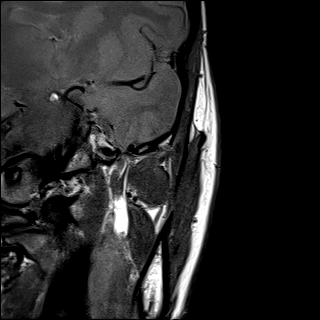

[48 of 48 positions shown; findings below may reference images not displayed]

FINDINGS: Right temporomandibular joint: Articular disc is displaced
anteriorly with degeneration of the posterior band. With mouth
opening there is no recapturing of the articular disc which remains
anteriorly displaced. With mouth opening there is limited anterior
translation of the mandibular condyle with the condyle articulating
with the posterior margin of the occipital eminence. No joint
effusion.

Left temporomandibular joint: Articular disc is displaced anteriorly
with degeneration of the central portion and posterior band. With
mouth opening there is no recapturing of the articular disc which
remains anteriorly displaced. With mouth opening there is limited
anterior translation of the mandibular condyle with only slight
anterior translation and the condyle articulating with the posterior
margin of the occipital eminence. No joint effusion.

Other: No marrow signal abnormality. Visualized paranasal sinuses
are clear. Visualized brain demonstrates no focal abnormality.
IMPRESSION: 1. Abnormal bilateral temporomandibular joints as described above.

## 2024-10-02 ENCOUNTER — Ambulatory Visit: Admitting: Physical Therapy

## 2024-10-30 ENCOUNTER — Ambulatory Visit: Admitting: Physical Therapy
